# Patient Record
Sex: Female | Born: 1985 | Race: Black or African American | Hispanic: No | Marital: Married | Smoking: Current every day smoker
Health system: Southern US, Community
[De-identification: ages and names within clinical notes are randomized; demographics above are authoritative.]

## PROBLEM LIST (undated history)

## (undated) DIAGNOSIS — J45909 Unspecified asthma, uncomplicated: Secondary | ICD-10-CM

## (undated) DIAGNOSIS — R011 Cardiac murmur, unspecified: Secondary | ICD-10-CM

## (undated) DIAGNOSIS — D649 Anemia, unspecified: Secondary | ICD-10-CM

## (undated) HISTORY — PX: BREAST SURGERY: SHX581

## (undated) HISTORY — PX: UPPER GI ENDOSCOPY: SHX6162

---
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
## 2009-03-02 ENCOUNTER — Emergency Department (HOSPITAL_BASED_OUTPATIENT_CLINIC_OR_DEPARTMENT_OTHER): Admission: EM | Admit: 2009-03-02 | Discharge: 2009-03-02 | Payer: Self-pay | Admitting: Emergency Medicine

## 2009-03-26 ENCOUNTER — Emergency Department (HOSPITAL_COMMUNITY): Admission: EM | Admit: 2009-03-26 | Discharge: 2009-03-26 | Payer: Self-pay | Admitting: Emergency Medicine

## 2009-12-31 ENCOUNTER — Emergency Department (HOSPITAL_COMMUNITY): Admission: EM | Admit: 2009-12-31 | Discharge: 2010-01-01 | Payer: Self-pay | Admitting: Emergency Medicine

## 2010-03-31 ENCOUNTER — Emergency Department (HOSPITAL_COMMUNITY)
Admission: EM | Admit: 2010-03-31 | Discharge: 2010-03-31 | Payer: Self-pay | Source: Home / Self Care | Admitting: Emergency Medicine

## 2010-07-11 LAB — DIFFERENTIAL
Basophils Absolute: 0 10*3/uL (ref 0.0–0.1)
Basophils Relative: 1 % (ref 0–1)
Eosinophils Absolute: 0.2 10*3/uL (ref 0.0–0.7)
Eosinophils Relative: 5 % (ref 0–5)
Lymphocytes Relative: 59 % — ABNORMAL HIGH (ref 12–46)
Lymphs Abs: 2.4 10*3/uL (ref 0.7–4.0)
Monocytes Absolute: 0.3 10*3/uL (ref 0.1–1.0)
Monocytes Relative: 7 % (ref 3–12)
Neutro Abs: 1.2 10*3/uL — ABNORMAL LOW (ref 1.7–7.7)
Neutrophils Relative %: 29 % — ABNORMAL LOW (ref 43–77)

## 2010-07-11 LAB — COMPREHENSIVE METABOLIC PANEL
ALT: 12 U/L (ref 0–35)
AST: 20 U/L (ref 0–37)
Albumin: 4 g/dL (ref 3.5–5.2)
Alkaline Phosphatase: 51 U/L (ref 39–117)
BUN: 13 mg/dL (ref 6–23)
CO2: 24 mEq/L (ref 19–32)
Calcium: 9.6 mg/dL (ref 8.4–10.5)
Chloride: 105 mEq/L (ref 96–112)
Creatinine, Ser: 0.54 mg/dL (ref 0.4–1.2)
GFR calc Af Amer: >60 mL/min (ref >60)
GFR calc non Af Amer: >60 mL/min (ref >60)
Glucose, Bld: 93 mg/dL (ref 70–99)
Potassium: 4.1 mEq/L (ref 3.5–5.1)
Sodium: 141 mEq/L (ref 135–145)
Total Bilirubin: 0.1 mg/dL — ABNORMAL LOW (ref 0.3–1.2)
Total Protein: 7 g/dL (ref 6.0–8.3)

## 2010-07-11 LAB — CBC
HCT: 37.4 % (ref 36.0–46.0)
Hemoglobin: 12.6 g/dL (ref 12.0–15.0)
MCH: 29 pg (ref 26.0–34.0)
MCHC: 33.7 g/dL (ref 30.0–36.0)
MCV: 86.2 fL (ref 78.0–100.0)
Platelets: 233 10*3/uL (ref 150–400)
RBC: 4.34 MIL/uL (ref 3.87–5.11)
RDW: 14.8 % (ref 11.5–15.5)
WBC: 4.2 10*3/uL (ref 4.0–10.5)

## 2010-07-11 LAB — LIPASE, BLOOD: Lipase: 25 U/L (ref 11–59)

## 2010-07-22 ENCOUNTER — Emergency Department (HOSPITAL_COMMUNITY)
Admission: EM | Admit: 2010-07-22 | Discharge: 2010-07-23 | Disposition: A | Discharge status: Home / Self Care | Payer: Medicaid Other | Attending: Emergency Medicine | Admitting: Emergency Medicine

## 2010-07-22 DIAGNOSIS — J45909 Unspecified asthma, uncomplicated: Secondary | ICD-10-CM | POA: Insufficient documentation

## 2010-07-23 ENCOUNTER — Emergency Department (HOSPITAL_COMMUNITY): Payer: Medicaid Other

## 2010-08-03 LAB — RHOGAM INJECTION

## 2010-08-03 LAB — WET PREP, GENITAL
Clue Cells Wet Prep HPF POC: NONE SEEN
Trich, Wet Prep: NONE SEEN
Yeast Wet Prep HPF POC: NONE SEEN

## 2010-08-03 LAB — URINALYSIS, ROUTINE W REFLEX MICROSCOPIC
Bilirubin Urine: NEGATIVE
Glucose, UA: NEGATIVE mg/dL
Ketones, ur: NEGATIVE mg/dL
Nitrite: NEGATIVE
Protein, ur: 30 mg/dL — AB
Specific Gravity, Urine: 1.022 (ref 1.005–1.030)
Urobilinogen, UA: 0.2 mg/dL (ref 0.0–1.0)
pH: 5.5 (ref 5.0–8.0)

## 2010-08-03 LAB — GC/CHLAMYDIA PROBE AMP, GENITAL
Chlamydia, DNA Probe: NEGATIVE
GC Probe Amp, Genital: NEGATIVE

## 2010-08-03 LAB — URINE MICROSCOPIC-ADD ON

## 2010-08-03 LAB — ABO/RH: ABO/RH(D): AB NEG

## 2010-08-03 LAB — POCT PREGNANCY, URINE: Preg Test, Ur: POSITIVE

## 2011-03-25 ENCOUNTER — Encounter: Payer: Self-pay | Admitting: *Deleted

## 2011-03-25 ENCOUNTER — Emergency Department (HOSPITAL_COMMUNITY)
Admission: EM | Admit: 2011-03-25 | Discharge: 2011-03-25 | Disposition: A | Discharge status: Home / Self Care | Payer: Medicaid Other | Attending: Emergency Medicine | Admitting: Emergency Medicine

## 2011-03-25 DIAGNOSIS — L509 Urticaria, unspecified: Secondary | ICD-10-CM | POA: Insufficient documentation

## 2011-03-25 DIAGNOSIS — L298 Other pruritus: Secondary | ICD-10-CM | POA: Insufficient documentation

## 2011-03-25 DIAGNOSIS — L2989 Other pruritus: Secondary | ICD-10-CM | POA: Insufficient documentation

## 2011-03-25 DIAGNOSIS — T7840XA Allergy, unspecified, initial encounter: Secondary | ICD-10-CM | POA: Insufficient documentation

## 2011-03-25 DIAGNOSIS — R21 Rash and other nonspecific skin eruption: Secondary | ICD-10-CM | POA: Insufficient documentation

## 2011-03-25 DIAGNOSIS — I10 Essential (primary) hypertension: Secondary | ICD-10-CM | POA: Insufficient documentation

## 2011-03-25 DIAGNOSIS — R0602 Shortness of breath: Secondary | ICD-10-CM | POA: Insufficient documentation

## 2011-03-25 HISTORY — DX: Cardiac murmur, unspecified: R01.1

## 2011-03-25 HISTORY — DX: Anemia, unspecified: D64.9

## 2011-03-25 MED ORDER — FAMOTIDINE 20 MG PO TABS: 20.0000 mg | ORAL_TABLET | Freq: Two times a day (BID) | ORAL | Status: DC

## 2011-03-25 MED ORDER — PREDNISONE 20 MG PO TABS
60.0000 mg | ORAL_TABLET | Freq: Once | ORAL | Status: AC
  Administered 2011-03-25: 60 mg via ORAL
  Filled 2011-03-25: qty 3

## 2011-03-25 MED ORDER — FAMOTIDINE 20 MG PO TABS
20.0000 mg | ORAL_TABLET | Freq: Once | ORAL | Status: AC
  Administered 2011-03-25: 20 mg via ORAL
  Filled 2011-03-25: qty 1

## 2011-03-25 MED ORDER — PREDNISONE 20 MG PO TABS: 60.0000 mg | ORAL_TABLET | Freq: Every day | ORAL | Status: AC

## 2011-03-25 MED ORDER — EPINEPHRINE 0.3 MG/0.3ML IJ DEVI: 0.3000 mg | INTRAMUSCULAR | Status: DC | PRN

## 2011-03-25 MED ORDER — DIPHENHYDRAMINE HCL 25 MG PO TABS: 25.0000 mg | ORAL_TABLET | Freq: Four times a day (QID) | ORAL | Status: AC

## 2011-03-25 MED ORDER — DIPHENHYDRAMINE HCL 25 MG PO CAPS
25.0000 mg | ORAL_CAPSULE | Freq: Once | ORAL | Status: AC
  Administered 2011-03-25: 25 mg via ORAL
  Filled 2011-03-25: qty 1

## 2011-03-25 NOTE — ED Notes (Addendum)
C/o L sided rib/chest pain, pinpoints to axillary area, also L neck and bilateral wrist/hand/FAs tingling, also sob, worse with inspiration, h/o asthma. Describes pain as tightness.

## 2011-03-25 NOTE — ED Provider Notes (Signed)
History     CSN: [id number] Arrival date & time: 03/25/2011  2:15 AM   First MD Initiated Contact with Patient 03/25/11 0518      Chief Complaint  Patient presents with  . Shortness of Breath    L axillary side pain, worse with deep breath, also L neck and bilateral hand/wrists/FAs    (Consider location/radiation/quality/duration/timing/severity/associated sxs/prior treatment) Patient is a 25 y.o. female presenting with allergic reaction. The history is provided by the patient.  Allergic Reaction The primary symptoms are  shortness of breath, rash and urticaria. The primary symptoms do not include abdominal pain or palpitations. The current episode started 1 to 2 hours ago. The problem has been gradually improving. This is a new problem.  The shortness of breath began today. The shortness of breath developed gradually. The shortness of breath is mild.  The rash is associated with itching.  Associated with: No new medications. Patient believes is due to getting collard greens around 10 PM. Her symptoms started around 1 AM. Significant symptoms also include itching.   onset today. Has a rash and itching and some shortness of breath, she used her inhaler at home and is now no longer having any difficulty breathing. Note, or lip swelling. Rash and itching are persistent. No history of the same. Moderate in severity. No history of anaphylaxis. No known food allergies. Patient states she's been meeting greens all day long  Past Medical History  Diagnosis Date  . Asthma   . Heart murmur   . Anemia     History reviewed. No pertinent past surgical history.  Family History  Problem Relation Age of Onset  . Asthma Mother   . Hypertension Mother     History  Substance Use Topics  . Smoking status: Current Everyday Smoker -- 2.0 packs/day  . Smokeless tobacco: Not on file  . Alcohol Use: No    OB History    Grav Para Term Preterm Abortions TAB SAB Ect Mult Living                   Review of Systems  Constitutional: Negative for fever and chills.  HENT: Negative for neck pain and neck stiffness.   Eyes: Negative for pain.  Respiratory: Positive for shortness of breath.   Cardiovascular: Negative for chest pain, palpitations and leg swelling.  Gastrointestinal: Negative for abdominal pain.  Genitourinary: Negative for dysuria.  Musculoskeletal: Negative for back pain.  Skin: Positive for itching and rash.  Neurological: Negative for headaches.  All other systems reviewed and are negative.    Allergies  Review of patient's allergies indicates no known allergies.  Home Medications  No current outpatient prescriptions on file.  BP 121/71  Pulse 69  Temp(Src) 98.2 F (36.8 C) (Oral)  Resp 18  Ht 5\' 4"  (1.626 m)  Wt 156 lb (70.761 kg)  BMI 26.78 kg/m2  SpO2 98%  LMP 02/13/2011  Physical Exam  Constitutional: She is oriented to person, place, and time. She appears well-developed and well-nourished.  HENT:  Head: Normocephalic and atraumatic.  Eyes: Conjunctivae and EOM are normal. Pupils are equal, round, and reactive to light.  Neck: Trachea normal. Neck supple. No thyromegaly present.  Cardiovascular: Normal rate, regular rhythm, S1 normal, S2 normal and normal pulses.     No systolic murmur is present   No diastolic murmur is present  Pulses:      Radial pulses are 2+ on the right side, and 2+ on the left side.  Pulmonary/Chest:  Effort normal and breath sounds normal. No stridor. She has no wheezes. She has no rhonchi.  Abdominal: Soft. Normal appearance and bowel sounds are normal. There is no tenderness. There is no CVA tenderness and negative Murphy's sign.  Musculoskeletal:       BLE:s Calves nontender, no cords or erythema, negative Homans sign  Neurological: She is alert and oriented to person, place, and time. She has normal strength. No cranial nerve deficit or sensory deficit. GCS eye subscore is 4. GCS verbal subscore is 5. GCS motor  subscore is 6.  Skin: Skin is warm and dry. Rash noted. She is not diaphoretic.       Erythematous and urticarial rash involves left-sided neck and torso multiple areas consistent with hives  Psychiatric: Her speech is normal.       Cooperative and appropriate    ED Course  Procedures (including critical care time)   Medications provided for clinical allergic reaction  MDM   Mild symptoms on arrival ED. by mouth medications and observation. No stridor or airway compromise. Patient stable for discharge home with EpiPen, prednisone, Benadryl, Pepcid. Reliable historian states understanding strict return precautions       Sunnie Nielsen, MD 03/25/11 [phone]

## 2019-11-12 ENCOUNTER — Encounter (HOSPITAL_COMMUNITY): Payer: Self-pay | Admitting: Emergency Medicine

## 2019-11-12 ENCOUNTER — Emergency Department (HOSPITAL_COMMUNITY)
Admission: EM | Admit: 2019-11-12 | Discharge: 2019-11-13 | Disposition: A | Discharge status: Home / Self Care | Payer: Medicaid Other | Attending: Emergency Medicine | Admitting: Emergency Medicine

## 2019-11-12 DIAGNOSIS — R1030 Lower abdominal pain, unspecified: Secondary | ICD-10-CM | POA: Insufficient documentation

## 2019-11-12 DIAGNOSIS — N920 Excessive and frequent menstruation with regular cycle: Secondary | ICD-10-CM | POA: Insufficient documentation

## 2019-11-12 DIAGNOSIS — Z5321 Procedure and treatment not carried out due to patient leaving prior to being seen by health care provider: Secondary | ICD-10-CM | POA: Insufficient documentation

## 2019-11-12 LAB — BASIC METABOLIC PANEL
Anion gap: 8 (ref 5–15)
BUN: 17 mg/dL (ref 6–20)
CO2: 22 mmol/L (ref 22–32)
Calcium: 9 mg/dL (ref 8.9–10.3)
Chloride: 111 mmol/L (ref 98–111)
Creatinine, Ser: 0.64 mg/dL (ref 0.44–1.00)
GFR calc Af Amer: >60 mL/min (ref >60)
GFR calc non Af Amer: >60 mL/min (ref >60)
Glucose, Bld: 96 mg/dL (ref 70–99)
Potassium: 4 mmol/L (ref 3.5–5.1)
Sodium: 141 mmol/L (ref 135–145)

## 2019-11-12 LAB — I-STAT BETA HCG BLOOD, ED (MC, WL, AP ONLY): I-stat hCG, quantitative: <5 m[IU]/mL (ref <5)

## 2019-11-12 LAB — CBC
HCT: 29.8 % — ABNORMAL LOW (ref 36.0–46.0)
Hemoglobin: 9.3 g/dL — ABNORMAL LOW (ref 12.0–15.0)
MCH: 26.9 pg (ref 26.0–34.0)
MCHC: 31.2 g/dL (ref 30.0–36.0)
MCV: 86.1 fL (ref 80.0–100.0)
Platelets: 436 10*3/uL — ABNORMAL HIGH (ref 150–400)
RBC: 3.46 MIL/uL — ABNORMAL LOW (ref 3.87–5.11)
RDW: 13.4 % (ref 11.5–15.5)
WBC: 6.4 10*3/uL (ref 4.0–10.5)
nRBC: 0 % (ref 0.0–0.2)

## 2019-11-12 NOTE — ED Notes (Signed)
Pt does not want to wait, she will jut follow up with her PCP

## 2019-11-12 NOTE — ED Provider Notes (Signed)
Patient placed in Quick Look pathway, seen and evaluated   Chief Complaint: vaginal bleeding  HPI:   Vaginal bleeding with intermittent lower abdominal cramping since June 1 with a short break at the end of June. Denies fever, N/V/D, syncope, current abdominal pain.  ROS: Vaginal bleeding (one)  Physical Exam:   Gen: No distress  Neuro: Awake and Alert  Skin: Warm    Focused Exam:   No abdominal tenderness.    Initiation of care has begun. The patient has been counseled on the process, plan, and necessity for staying for the completion/evaluation, and the remainder of the medical screening examination   Concepcion Living 11/12/19 1840    Melene Plan, DO 11/12/19 2333

## 2019-11-12 NOTE — ED Triage Notes (Signed)
Patient in POV, reports lower abd pain X several weeks as well as heavy menstrual cycle. States she has taken birth control X2 years with no period and recently was taken off and has had a continuous period since beginning of July.

## 2019-11-13 ENCOUNTER — Telehealth (HOSPITAL_COMMUNITY): Payer: Self-pay | Admitting: Emergency Medicine

## 2019-11-13 NOTE — Telephone Encounter (Cosign Needed)
2:35 PM Patient called back in response to my previous phone call and VM.  We discussed her hemoglobin level of 9.3, previously 11.0 three weeks ago.  Suspected to be due to her recurrent vaginal bleeding.  Continues to deny syncope, chest pain, shortness of breath. States she will follow-up with her PCP on this matter.  We discussed OB/GYN follow-up.  She states she will work through her PCP for any OB/GYN follow-up or referral. Return precautions discussed.  She voices understanding and agreement.

## 2019-11-13 NOTE — ED Notes (Signed)
Pt did not respond when called for vitals recheck 

## 2019-11-13 NOTE — Telephone Encounter (Cosign Needed)
2:27 PM Attempted to call patient to discuss abnormal lab results that were noted from her ED visit yesterday.  No answer.  Left voicemail with callback number.

## 2020-05-14 ENCOUNTER — Other Ambulatory Visit: Payer: Self-pay

## 2020-05-14 ENCOUNTER — Encounter (HOSPITAL_COMMUNITY): Payer: Self-pay

## 2020-05-14 ENCOUNTER — Emergency Department (HOSPITAL_COMMUNITY): Payer: Medicaid Other

## 2020-05-14 ENCOUNTER — Emergency Department (HOSPITAL_COMMUNITY)
Admission: EM | Admit: 2020-05-14 | Discharge: 2020-05-14 | Disposition: A | Discharge status: Home / Self Care | Payer: Medicaid Other | Attending: Emergency Medicine | Admitting: Emergency Medicine

## 2020-05-14 DIAGNOSIS — Y92512 Supermarket, store or market as the place of occurrence of the external cause: Secondary | ICD-10-CM | POA: Diagnosis not present

## 2020-05-14 DIAGNOSIS — M79672 Pain in left foot: Secondary | ICD-10-CM | POA: Insufficient documentation

## 2020-05-14 DIAGNOSIS — Y990 Civilian activity done for income or pay: Secondary | ICD-10-CM | POA: Diagnosis not present

## 2020-05-14 DIAGNOSIS — J45909 Unspecified asthma, uncomplicated: Secondary | ICD-10-CM | POA: Insufficient documentation

## 2020-05-14 DIAGNOSIS — F17210 Nicotine dependence, cigarettes, uncomplicated: Secondary | ICD-10-CM | POA: Insufficient documentation

## 2020-05-14 DIAGNOSIS — M79644 Pain in right finger(s): Secondary | ICD-10-CM | POA: Insufficient documentation

## 2020-05-14 MED ORDER — NAPROXEN 500 MG PO TABS: 500.0000 mg | ORAL_TABLET | Freq: Two times a day (BID) | ORAL | 0 refills | Status: DC

## 2020-05-14 NOTE — ED Triage Notes (Signed)
Pt c/o pain to right ring finger states "I fractured my finger, I hit it on something yesterday" pt also c/o Left foot pain.

## 2020-05-14 NOTE — ED Provider Notes (Signed)
[facility] EMERGENCY DEPARTMENT Provider Note   CSN: [id number] Arrival date & time: 05/14/20  1326     History Chief Complaint  Patient presents with  . Finger Injury    Foot pain    . Foot Pain    Robin Mcbride is a 35 y.o. female.  HPI Patient is a 35 year old female who presents to the emergency department due to finger pain and foot pain.  Symptoms started in the past few days.  She states she believes she was reaching for something and accidentally hyperextended her right ring finger.  History of fracture to the finger.  Reports moderate pain and swelling to the middle phalanx of the right finger.  Also reports left foot pain.  She works at [employer] and states she is on her feet and ambulates throughout the day.  Believes she might have struck her foot on a piece of furniture.  Denies numbness, tingling, weakness.    Past Medical History:  Diagnosis Date  . Anemia   . Asthma   . Heart murmur     There are no problems to display for this patient.   Past Surgical History:  Procedure Laterality Date  . BREAST SURGERY       OB History   No obstetric history on file.     Family History  Problem Relation Age of Onset  . Asthma Mother   . Hypertension Mother     Social History   Tobacco Use  . Smoking status: Current Every Day Smoker    Packs/day: 2.00  . Smokeless tobacco: Never Used  Substance Use Topics  . Alcohol use: No  . Drug use: No    Home Medications Prior to Admission medications   Medication Sig Start Date End Date Taking? Authorizing Provider  naproxen (NAPROSYN) 500 MG tablet Take 1 tablet (500 mg total) by mouth 2 (two) times daily. 05/14/20  Yes Placido Sou, PA-C  EPINEPHrine (EPIPEN) 0.3 mg/0.3 mL DEVI Inject 0.3 mLs (0.3 mg total) into the muscle as needed. 03/25/11   Sunnie Nielsen, MD  famotidine (PEPCID) 20 MG tablet Take 1 tablet (20 mg total) by mouth 2 (two) times daily. 03/25/11 03/24/12  Sunnie Nielsen, MD     Allergies    Patient has no known allergies.  Review of Systems   Review of Systems  Musculoskeletal: Positive for arthralgias, joint swelling and myalgias.  Skin: Negative for color change and wound.  Neurological: Negative for weakness and numbness.    Physical Exam Updated Vital Signs BP 123/72   Pulse 91   Temp 98 F (36.7 C) (Oral)   Resp 16   SpO2 96%   Physical Exam Vitals and nursing note reviewed.  Constitutional:      General: She is not in acute distress.    Appearance: She is well-developed.  HENT:     Head: Normocephalic and atraumatic.     Right Ear: External ear normal.     Left Ear: External ear normal.  Eyes:     General: No scleral icterus.       Right eye: No discharge.        Left eye: No discharge.     Conjunctiva/sclera: Conjunctivae normal.  Neck:     Trachea: No tracheal deviation.  Cardiovascular:     Rate and Rhythm: Normal rate.  Pulmonary:     Effort: Pulmonary effort is normal. No respiratory distress.     Breath sounds: No stridor.  Abdominal:  General: There is no distension.  Musculoskeletal:        General: Tenderness present. No swelling or deformity.     Cervical back: Neck supple.     Right lower leg: No edema.     Left lower leg: No edema.     Comments: Mild TTP noted along the first left MTP.  No increased warmth, redness, swelling.  Unable to assess range of motion secondary to patient's pain.  Patient is ambulatory.  Distal sensation intact.  Good cap refill.  Additional mild TTP noted along the middle phalanx of the right ring finger.  Trace edema in the region.  No erythema or increased warmth.  No signs of infection or paronychia on my exam.  Unable to assess range of motion secondary to pain.  Distal sensation intact.  Good cap refill.  Skin:    General: Skin is warm and dry.     Findings: No rash.  Neurological:     Mental Status: She is alert.     Cranial Nerves: Cranial nerve deficit: no gross deficits.     ED Results / Procedures / Treatments   Labs (all labs ordered are listed, but only abnormal results are displayed) Labs Reviewed - No data to display  EKG None  Radiology DG Finger Ring Right  Result Date: 05/14/2020 CLINICAL DATA:  Injury EXAM: RIGHT RING FINGER 2+V COMPARISON:  None. FINDINGS: No acute fracture or dislocation of the ring finger. There appears to have been a prior, well callused oblique fracture of the middle phalanx. Joint spaces are preserved. Soft tissue edema. IMPRESSION: 1. No acute fracture or dislocation of the ring finger. There appears to have been a prior, well callused oblique fracture of the middle phalanx. 2.  Soft tissue edema. Electronically Signed   By: Lauralyn Primes M.D.   On: 05/14/2020 14:02   Procedures Procedures (including critical care time)  Medications Ordered in ED Medications - No data to display  ED Course  I have reviewed the triage vital signs and the nursing notes.  Pertinent labs & imaging results that were available during my care of the patient were reviewed by me and considered in my medical decision making (see chart for details).    MDM Rules/Calculators/A&P                          Patient presents with right finger pain as well as left foot pain.  History of fracture to the affected finger.  X-ray obtained in triage showing no acute fracture or dislocation.  There is a prior well callused oblique fracture of the middle phalanx.  Patient is tender over the middle phalanx.  There is some mild soft tissue edema.  No increased warmth.  No signs of paronychia or infection.  Additional mild tenderness along the first MTP of the left foot.  Patient believes she struck her foot on a piece of furniture but is unsure.  She is ambulatory.  Neurovascularly intact in the hand and the foot.  No red flags.  Requested a splint for the finger.  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  Will discharge on a course of naproxen.  Given orthopedic follow-up.  Discussed return precautions.  Her questions were answered and she was amicable at the time of discharge.  Final Clinical Impression(s) / ED Diagnoses Final diagnoses:  Finger pain,  right  Left foot pain   Rx / DC Orders ED Discharge Orders         Ordered    naproxen (NAPROSYN) 500 MG tablet  2 times daily        05/14/20 1523           Placido Sou, PA-C 05/14/20 1531    Terald Sleeper, MD 05/14/20 [phone]

## 2020-05-14 NOTE — Discharge Instructions (Addendum)
Like we discussed, please apply ice to your foot and finger.  I have prescribed you an anti-inflammatory called naproxen.  Please take this as prescribed.  I have given you follow-up information below with a local orthopedist in the area.  Please feel free to give them a call if your symptoms do not improve.  You can always return to the emergency department if your symptoms worsen.  It was a pleasure to meet you.

## 2020-11-16 ENCOUNTER — Other Ambulatory Visit: Payer: Self-pay

## 2020-11-16 ENCOUNTER — Encounter (HOSPITAL_BASED_OUTPATIENT_CLINIC_OR_DEPARTMENT_OTHER): Payer: Self-pay | Admitting: Emergency Medicine

## 2020-11-16 ENCOUNTER — Encounter (HOSPITAL_COMMUNITY): Payer: Self-pay

## 2020-11-16 ENCOUNTER — Emergency Department (HOSPITAL_BASED_OUTPATIENT_CLINIC_OR_DEPARTMENT_OTHER)
Admission: EM | Admit: 2020-11-16 | Discharge: 2020-11-16 | Disposition: A | Discharge status: Home / Self Care | Payer: Medicaid Other | Attending: Emergency Medicine | Admitting: Emergency Medicine

## 2020-11-16 DIAGNOSIS — R5383 Other fatigue: Secondary | ICD-10-CM | POA: Insufficient documentation

## 2020-11-16 DIAGNOSIS — R112 Nausea with vomiting, unspecified: Secondary | ICD-10-CM | POA: Diagnosis not present

## 2020-11-16 DIAGNOSIS — R000 Tachycardia, unspecified: Secondary | ICD-10-CM | POA: Insufficient documentation

## 2020-11-16 DIAGNOSIS — R197 Diarrhea, unspecified: Secondary | ICD-10-CM | POA: Insufficient documentation

## 2020-11-16 DIAGNOSIS — J45909 Unspecified asthma, uncomplicated: Secondary | ICD-10-CM | POA: Diagnosis not present

## 2020-11-16 DIAGNOSIS — R1013 Epigastric pain: Secondary | ICD-10-CM | POA: Diagnosis not present

## 2020-11-16 DIAGNOSIS — F17210 Nicotine dependence, cigarettes, uncomplicated: Secondary | ICD-10-CM | POA: Diagnosis not present

## 2020-11-16 DIAGNOSIS — K529 Noninfective gastroenteritis and colitis, unspecified: Secondary | ICD-10-CM

## 2020-11-16 HISTORY — DX: Unspecified asthma, uncomplicated: J45.909

## 2020-11-16 LAB — COMPREHENSIVE METABOLIC PANEL
ALT: 16 U/L (ref 0–44)
AST: 17 U/L (ref 15–41)
Albumin: 4.4 g/dL (ref 3.5–5.0)
Alkaline Phosphatase: 71 U/L (ref 38–126)
Anion gap: 9 (ref 5–15)
BUN: 9 mg/dL (ref 6–20)
CO2: 26 mmol/L (ref 22–32)
Calcium: 9.6 mg/dL (ref 8.9–10.3)
Chloride: 104 mmol/L (ref 98–111)
Creatinine, Ser: 0.63 mg/dL (ref 0.44–1.00)
GFR, Estimated: >60 mL/min (ref >60)
Glucose, Bld: 100 mg/dL — ABNORMAL HIGH (ref 70–99)
Potassium: 3.7 mmol/L (ref 3.5–5.1)
Sodium: 139 mmol/L (ref 135–145)
Total Bilirubin: 0.5 mg/dL (ref 0.3–1.2)
Total Protein: 7.7 g/dL (ref 6.5–8.1)

## 2020-11-16 LAB — URINALYSIS, ROUTINE W REFLEX MICROSCOPIC
Bilirubin Urine: NEGATIVE
Glucose, UA: NEGATIVE mg/dL
Hgb urine dipstick: NEGATIVE
Ketones, ur: NEGATIVE mg/dL
Leukocytes,Ua: NEGATIVE
Nitrite: NEGATIVE
Protein, ur: NEGATIVE mg/dL
Specific Gravity, Urine: >1.03 — ABNORMAL HIGH (ref 1.005–1.030)
pH: 5.5 (ref 5.0–8.0)

## 2020-11-16 LAB — CBC WITH DIFFERENTIAL/PLATELET
Abs Immature Granulocytes: 0.02 10*3/uL (ref 0.00–0.07)
Basophils Absolute: 0 10*3/uL (ref 0.0–0.1)
Basophils Relative: 1 %
Eosinophils Absolute: 0.1 10*3/uL (ref 0.0–0.5)
Eosinophils Relative: 2 %
HCT: 39.4 % (ref 36.0–46.0)
Hemoglobin: 12.9 g/dL (ref 12.0–15.0)
Immature Granulocytes: 0 %
Lymphocytes Relative: 40 %
Lymphs Abs: 2.4 10*3/uL (ref 0.7–4.0)
MCH: 26.7 pg (ref 26.0–34.0)
MCHC: 32.7 g/dL (ref 30.0–36.0)
MCV: 81.6 fL (ref 80.0–100.0)
Monocytes Absolute: 0.3 10*3/uL (ref 0.1–1.0)
Monocytes Relative: 4 %
Neutro Abs: 3.2 10*3/uL (ref 1.7–7.7)
Neutrophils Relative %: 53 %
Platelets: 381 10*3/uL (ref 150–400)
RBC: 4.83 MIL/uL (ref 3.87–5.11)
RDW: 16.9 % — ABNORMAL HIGH (ref 11.5–15.5)
WBC: 6.1 10*3/uL (ref 4.0–10.5)
nRBC: 0 % (ref 0.0–0.2)

## 2020-11-16 LAB — PREGNANCY, URINE: Preg Test, Ur: NEGATIVE

## 2020-11-16 LAB — LIPASE, BLOOD: Lipase: 25 U/L (ref 11–51)

## 2020-11-16 MED ORDER — ONDANSETRON 4 MG PO TBDP: 4.0000 mg | ORAL_TABLET | Freq: Three times a day (TID) | ORAL | 0 refills | Status: DC | PRN

## 2020-11-16 MED ORDER — LIDOCAINE VISCOUS HCL 2 % MT SOLN
15.0000 mL | Freq: Once | OROMUCOSAL | Status: AC
  Administered 2020-11-16: 15 mL via ORAL
  Filled 2020-11-16: qty 15

## 2020-11-16 MED ORDER — ALUM & MAG HYDROXIDE-SIMETH 200-200-20 MG/5ML PO SUSP
30.0000 mL | Freq: Once | ORAL | Status: AC
  Administered 2020-11-16: 30 mL via ORAL
  Filled 2020-11-16: qty 30

## 2020-11-16 MED ORDER — SODIUM CHLORIDE 0.9 % IV BOLUS
1000.0000 mL | Freq: Once | INTRAVENOUS | Status: AC
  Administered 2020-11-16: 1000 mL via INTRAVENOUS

## 2020-11-16 MED ORDER — PANTOPRAZOLE SODIUM 40 MG IV SOLR
40.0000 mg | Freq: Once | INTRAVENOUS | Status: AC
  Administered 2020-11-16: 40 mg via INTRAVENOUS
  Filled 2020-11-16: qty 40

## 2020-11-16 MED ORDER — ONDANSETRON HCL 4 MG/2ML IJ SOLN
4.0000 mg | Freq: Once | INTRAMUSCULAR | Status: AC
  Administered 2020-11-16: 4 mg via INTRAVENOUS
  Filled 2020-11-16: qty 2

## 2020-11-16 NOTE — ED Notes (Signed)
States she arrived by EMS and no other means to return home by family, friend etc. Cab Voucher provided.

## 2020-11-16 NOTE — ED Provider Notes (Signed)
[facility] EMERGENCY DEPARTMENT Provider Note   CSN: [id number] Arrival date & time: 11/16/20  0153     History Chief Complaint  Patient presents with   Abdominal Pain    Robin Mcbride is a 35 y.o. female.  Patient reports epigastric pain with nausea, vomiting and diarrhea since approximately 3 AM July 18.  Awoke with nausea and multiple episodes of nonbloody nonbilious emesis x4 throughout the day yesterday.  Has had 2 episodes of loose nonbloody diarrhea as well.  Crampy upper abdominal pain that is constant.  No fever.  No pain with urination or blood in the urine.  No vaginal bleeding or discharge.  Denies any possibility of pregnancy.  No recent travel or sick contacts.  Did finish amoxicillin for a tonsil infection yesterday. No previous abdominal surgeries.  No chest pain or shortness of breath.  The history is provided by the patient.  Abdominal Pain Associated symptoms: diarrhea, fatigue, nausea and vomiting   Associated symptoms: no chest pain, no cough, no dysuria, no fever, no hematuria and no shortness of breath       Past Medical History:  Diagnosis Date   Asthma     There are no problems to display for this patient.   Past Surgical History:  Procedure Laterality Date   BREAST SURGERY       OB History   No obstetric history on file.     No family history on file.  Social History   Tobacco Use   Smoking status: Every Day    Packs/day: 1.00    Types: Cigarettes   Smokeless tobacco: Never  Substance Use Topics   Alcohol use: Never   Drug use: Never    Home Medications Prior to Admission medications   Not on File    Allergies    Motrin [ibuprofen]  Review of Systems   Review of Systems  Constitutional:  Positive for activity change, appetite change and fatigue. Negative for fever.  HENT:  Negative for congestion and rhinorrhea.   Respiratory:  Negative for cough, chest tightness and shortness of breath.   Cardiovascular:   Negative for chest pain and leg swelling.  Gastrointestinal:  Positive for abdominal pain, diarrhea, nausea and vomiting.  Genitourinary:  Negative for dysuria and hematuria.  Musculoskeletal:  Negative for arthralgias and myalgias.  Skin:  Negative for rash.  Neurological:  Negative for dizziness, weakness and headaches.   all other systems are negative except as noted in the HPI and PMH.   Physical Exam Updated Vital Signs BP 127/78 (BP Location: Right Arm)   Pulse (!) 108   Temp (!) 97.5 F (36.4 C) (Oral)   Resp 20   Ht 5\' 4"  (1.626 m)   Wt 88.5 kg   LMP 10/29/2020   SpO2 100%   BMI 33.47 kg/m   Physical Exam Vitals and nursing note reviewed.  Constitutional:      General: She is not in acute distress.    Appearance: She is well-developed.  HENT:     Head: Normocephalic and atraumatic.     Mouth/Throat:     Pharynx: No oropharyngeal exudate.  Eyes:     Conjunctiva/sclera: Conjunctivae normal.     Pupils: Pupils are equal, round, and reactive to light.  Neck:     Comments: No meningismus. Cardiovascular:     Rate and Rhythm: Normal rate and regular rhythm.     Heart sounds: Normal heart sounds. No murmur heard. Pulmonary:     Effort: Pulmonary  effort is normal. No respiratory distress.     Breath sounds: Normal breath sounds.  Abdominal:     Palpations: Abdomen is soft.     Tenderness: There is abdominal tenderness. There is no guarding or rebound.     Comments: Mild epigastric tenderness  Musculoskeletal:        General: No tenderness. Normal range of motion.     Cervical back: Normal range of motion and neck supple.  Skin:    General: Skin is warm.  Neurological:     Mental Status: She is alert and oriented to person, place, and time.     Cranial Nerves: No cranial nerve deficit.     Motor: No abnormal muscle tone.     Coordination: Coordination normal.     Comments:  5/5 strength throughout. CN 2-12 intact.Equal grip strength.   Psychiatric:         Behavior: Behavior normal.    ED Results / Procedures / Treatments   Labs (all labs ordered are listed, but only abnormal results are displayed) Labs Reviewed  URINALYSIS, ROUTINE W REFLEX MICROSCOPIC - Abnormal; Notable for the following components:      Result Value   Specific Gravity, Urine >1.030 (*)    All other components within normal limits  CBC WITH DIFFERENTIAL/PLATELET - Abnormal; Notable for the following components:   RDW 16.9 (*)    All other components within normal limits  COMPREHENSIVE METABOLIC PANEL - Abnormal; Notable for the following components:   Glucose, Bld 100 (*)    All other components within normal limits  PREGNANCY, URINE  LIPASE, BLOOD    EKG None  Radiology No results found.  Procedures Procedures   Medications Ordered in ED Medications  sodium chloride 0.9 % bolus 1,000 mL (has no administration in time range)  ondansetron (ZOFRAN) injection 4 mg (has no administration in time range)  pantoprazole (PROTONIX) injection 40 mg (has no administration in time range)    ED Course  I have reviewed the triage vital signs and the nursing notes.  Pertinent labs & imaging results that were available during my care of the patient were reviewed by me and considered in my medical decision making (see chart for details).    MDM Rules/Calculators/A&P                         24 hours of upper abdominal pain with nausea, vomiting and diarrhea.  Abdomen soft without peritoneal signs.  Moist mucous membranes, mildly tachycardic.  Give fluids and check labs. low suspicion for appendicitis or cholecystitis.  Labs reassuring. LFTs and lipase normal. No leukocytosis.   Patient tolerating p.o.  Abdomen is soft and nontender.  Suspect likely viral gastroenteritis versus foodborne illness.  Low suspicion for surgical pathology.  Advised p.o. hydration, antiemetics, clear liquid diet, PCP follow-up.  Return to the ED for worsening pain, fever, vomiting, any  other concerns. Final Clinical Impression(s) / ED Diagnoses Final diagnoses:  None    Rx / DC Orders ED Discharge Orders     None        Tiarra Anastacio, Jeannett Senior, MD 11/16/20 [phone]

## 2020-11-16 NOTE — ED Notes (Signed)
Pt aware of need for urine specimen, states she is unable to provide one at this time.

## 2020-11-16 NOTE — Discharge Instructions (Addendum)
Take the antinausea medication as prescribed.  Start with a clear liquid diet today and advance slowly as tolerated.  Follow-up with your doctor.  Return to the ED for worsening pain, fever, vomiting, unable to eat or drink or any other concerns.

## 2020-11-16 NOTE — ED Triage Notes (Signed)
States has been having n/v and diarrhea x 2 days .

## 2021-12-03 ENCOUNTER — Ambulatory Visit
Admission: RE | Admit: 2021-12-03 | Discharge: 2021-12-03 | Disposition: A | Discharge status: Home / Self Care | Payer: Medicaid Other | Source: Ambulatory Visit | Attending: Urgent Care | Admitting: Urgent Care

## 2021-12-03 VITALS — BP 120/45 | HR 73 | Temp 98.1°F | Resp 20

## 2021-12-03 DIAGNOSIS — L301 Dyshidrosis [pompholyx]: Secondary | ICD-10-CM

## 2021-12-03 DIAGNOSIS — H6982 Other specified disorders of Eustachian tube, left ear: Secondary | ICD-10-CM

## 2021-12-03 DIAGNOSIS — N926 Irregular menstruation, unspecified: Secondary | ICD-10-CM

## 2021-12-03 DIAGNOSIS — Z8616 Personal history of COVID-19: Secondary | ICD-10-CM

## 2021-12-03 DIAGNOSIS — N912 Amenorrhea, unspecified: Secondary | ICD-10-CM

## 2021-12-03 DIAGNOSIS — H938X2 Other specified disorders of left ear: Secondary | ICD-10-CM

## 2021-12-03 MED ORDER — LEVOCETIRIZINE DIHYDROCHLORIDE 5 MG PO TABS: 5.0000 mg | ORAL_TABLET | Freq: Every evening | ORAL | 0 refills | Status: AC

## 2021-12-03 MED ORDER — TRIAMCINOLONE ACETONIDE 0.1 % EX CREA: 1.0000 | TOPICAL_CREAM | Freq: Two times a day (BID) | CUTANEOUS | 0 refills | Status: DC

## 2021-12-03 MED ORDER — FLUTICASONE PROPIONATE 50 MCG/ACT NA SUSP: 2.0000 | Freq: Every day | NASAL | 12 refills | Status: DC

## 2021-12-03 MED ORDER — NAPROXEN 500 MG PO TABS: 500.0000 mg | ORAL_TABLET | Freq: Two times a day (BID) | ORAL | 0 refills | Status: DC

## 2021-12-03 MED ORDER — PSEUDOEPHEDRINE HCL 30 MG PO TABS: 30.0000 mg | ORAL_TABLET | Freq: Three times a day (TID) | ORAL | 0 refills | Status: DC | PRN

## 2021-12-03 NOTE — ED Triage Notes (Signed)
Pt here with painful and itchy rash on hands. Pt also here with heavy, painful period this month after not having a period for a long time after taking steroids for this rash. Pt aslo c/o left ear fullness.

## 2021-12-03 NOTE — ED Provider Notes (Signed)
[facility]   MRN: [id number] DOB: 1985/10/31  Subjective:   Robin Mcbride is a 36 y.o. female presenting for multiple chief complaints.   Rash - patient reports recurrent itchy rash over both of her hands.  She was initially treated for eczema of her hands in March with a topical steroid.  She states that this did not help as much and then worsened after she stopped using the topical steroid.  She was subsequently seen again and prescribed oral steroids.  Patient states that her symptoms improved a lot since then up until this current episode over the past week.  Reports that she washes her hands regularly and frequently.  Does not use hand sanitizer.  Feels like her hands are also dry.  Irregular cycles - patient started her cycle yesterday. Prior to that she had gone ~ 3 months without a cycle. No chance of pregnancy per patient.  No concern for STI.  Patient was previously on oral contraception and she stopped this a year ago.  She believes that her cycle was really thrown off by the prednisone course that she took in March as above.  She does have a PCP and has a follow-up coming up soon.  Denies any gynecologic history including PCOS, uterine fibroids, ovarian cyst, endometriosis.  Generally, patient was having regular cycles at the first of every month up until this past March since she stopped taking the contraception.  Her primary complaint now is having significant pelvic pains with her cycle.  Left ear problem - Patient was seen 10/21/2021.  Had a negative rapid strep test and was prescribed amoxicillin for pharyngitis. No culture was done.  However, she complains that she has had this particular symptoms since she had COVID about 6 months earlier.  No dizziness, ear drainage, tinnitus.  She is a smoker.  She has a history of allergic rhinitis and asthma.  Does not take anything consistently for these conditions.  No current facility-administered medications  for this encounter.  Current Outpatient Medications:    EPINEPHrine (EPIPEN) 0.3 mg/0.3 mL DEVI, Inject 0.3 mLs (0.3 mg total) into the muscle as needed., Disp: 1 Device, Rfl: 0   famotidine (PEPCID) 20 MG tablet, Take 1 tablet (20 mg total) by mouth 2 (two) times daily., Disp: 30 tablet, Rfl: 0   naproxen (NAPROSYN) 500 MG tablet, Take 1 tablet (500 mg total) by mouth 2 (two) times daily., Disp: 30 tablet, Rfl: 0   ondansetron (ZOFRAN ODT) 4 MG disintegrating tablet, Take 1 tablet (4 mg total) by mouth every 8 (eight) hours as needed for nausea or vomiting., Disp: 20 tablet, Rfl: 0   Allergies  Allergen Reactions   Motrin [Ibuprofen] Hives    Past Medical History:  Diagnosis Date   Anemia    Asthma    Asthma    Heart murmur      Past Surgical History:  Procedure Laterality Date   BREAST SURGERY      Family History  Problem Relation Age of Onset   Asthma Mother    Hypertension Mother     Social History   Tobacco Use   Smoking status: Every Day    Packs/day: 1.00    Types: Cigarettes   Smokeless tobacco: Never  Substance Use Topics   Alcohol use: Never   Drug use: Never    ROS   Objective:   Vitals: BP (!) 120/45   Pulse 73   Temp 98.1 F (36.7 C)  Resp 20   SpO2 98%   Physical Exam Constitutional:      General: She is not in acute distress.    Appearance: Normal appearance. She is well-developed. She is obese. She is not ill-appearing, toxic-appearing or diaphoretic.  HENT:     Head: Normocephalic and atraumatic.     Right Ear: Tympanic membrane, ear canal and external ear normal. No drainage or tenderness. No middle ear effusion. There is no impacted cerumen. Tympanic membrane is not erythematous.     Left Ear: Tympanic membrane, ear canal and external ear normal. No drainage or tenderness.  No middle ear effusion. There is no impacted cerumen. Tympanic membrane is not erythematous.     Nose: Nose normal. No congestion or rhinorrhea.      Mouth/Throat:     Mouth: Mucous membranes are moist. No oral lesions.     Pharynx: No pharyngeal swelling, oropharyngeal exudate, posterior oropharyngeal erythema or uvula swelling.     Tonsils: No tonsillar exudate or tonsillar abscesses.  Eyes:     General: No scleral icterus.       Right eye: No discharge.        Left eye: No discharge.     Extraocular Movements: Extraocular movements intact.     Right eye: Normal extraocular motion.     Left eye: Normal extraocular motion.     Conjunctiva/sclera: Conjunctivae normal.  Cardiovascular:     Rate and Rhythm: Normal rate.  Pulmonary:     Effort: Pulmonary effort is normal.  Abdominal:     General: Bowel sounds are normal. There is no distension.     Palpations: Abdomen is soft. There is no mass.     Tenderness: There is no abdominal tenderness. There is no right CVA tenderness, left CVA tenderness, guarding or rebound.  Musculoskeletal:     Cervical back: Normal range of motion and neck supple.  Lymphadenopathy:     Cervical: No cervical adenopathy.  Skin:    General: Skin is warm and dry.     Findings: Rash (Hyperpigmented papular rash over the radial aspect of her hands involving the palm, first finger, second finger) present.  Neurological:     General: No focal deficit present.     Mental Status: She is alert and oriented to person, place, and time.  Psychiatric:        Mood and Affect: Mood normal.        Behavior: Behavior normal.        Thought Content: Thought content normal.        Judgment: Judgment normal.     Assessment and Plan :   PDMP not reviewed this encounter.  1. Irregular menstrual cycle   2. Amenorrhea   3. Dyshidrotic eczema   4. Ear fullness, left   5. Eustachian tube dysfunction, left   6. History of COVID-19    I advised against using any particular medication to stop her current cycle as she is finally on track.  She has previously taken naproxen without any issues.  I counseled that they  would be important for her to pursue her general gynecologic checkup which we are not equipped to do here, specifically her Pap smear.  Also advised that it might be beneficial to pursue pelvic and transvaginal ultrasound to rule out any other etiologies that could affect her cycles and menstrual cramping.  For her dyshidrotic eczema I provided general counseling and management.  I advised that we should avoid oral steroids as she feels  that this affected her dramatically and her current symptoms are mild.  Advised that she start triamcinolone cream.  Discussed appropriate use of said steroid cream.  For her left ear fullness, I suspect allergic rhinitis, eustachian tube dysfunction.  She has had the symptoms for months and there is no sign of bacterial infection.  Recommended that she start triple therapy with Flonase, Xyzal and pseudoephedrine.  Smoking does affect this as well and we went over that too.   Follow-up with PCP as planned.  Counseled patient on potential for adverse effects with medications prescribed/recommended today, ER and return-to-clinic precautions discussed, patient verbalized understanding.    Wallis Bamberg, PA-C 12/03/21 1145

## 2022-01-12 ENCOUNTER — Ambulatory Visit
Admission: EM | Admit: 2022-01-12 | Discharge: 2022-01-12 | Disposition: A | Discharge status: Home / Self Care | Payer: Medicaid Other | Attending: Emergency Medicine | Admitting: Emergency Medicine

## 2022-01-12 DIAGNOSIS — L309 Dermatitis, unspecified: Secondary | ICD-10-CM

## 2022-01-12 DIAGNOSIS — F429 Obsessive-compulsive disorder, unspecified: Secondary | ICD-10-CM

## 2022-01-12 MED ORDER — TRIAMCINOLONE ACETONIDE 0.1 % EX CREA: 1.0000 | TOPICAL_CREAM | Freq: Two times a day (BID) | CUTANEOUS | 1 refills | Status: DC

## 2022-01-12 MED ORDER — SERTRALINE HCL 25 MG PO TABS: 25.0000 mg | ORAL_TABLET | Freq: Every day | ORAL | 1 refills | Status: AC

## 2022-01-12 NOTE — Discharge Instructions (Addendum)
Please resume taking Zoloft as has previously been prescribed by primary care provider.  Please take 1 tablet daily whether you are experiencing symptoms of anxiety or compulsive cleaning or not.  This medication only works if you take it daily.  I recommend that you continue this medication for at least 3 months before you discontinue it.  Please speak with your primary care provider before you discontinue it.  For the rash in your hands, please apply triamcinolone cream twice daily.  Once the triamcinolone cream has been absorbed into your hands, please apply Eucerin original healing cream to your hands to create a moisture barrier which will seal in the steroid to keep your skin calm.  When you clean, please be sure that you are wearing protective gloves to prevent injury to your skin.  Please follow-up with your primary care provider about this visit as soon as possible.  Thank you for visiting urgent care.

## 2022-01-12 NOTE — ED Provider Notes (Signed)
[facility]    CSN: [id number] Arrival date & time: 01/12/22  1100    HISTORY   Chief Complaint  Patient presents with   Rash   HPI Robin Mcbride is a pleasant, 36 y.o. female who presents to urgent care today. Patient reports a history of OCD and anxiety, has been prescribed Zoloft for this in the past but is not currently taking it, states she thinks her prescription has run out.  Patient states she continues to clean her home excessively, often using caustic bleaching products.  Patient states she does not wear gloves when doing so, continues to have rash on her hands that is itchy, burning, red.  Patient states that she has tried applying lotion to her hands which makes the skin burn more, states she was provided with a prescription for triamcinolone cream last month but her 30 g tube has run out.  Patient states triamcinolone cream is very helpful.  The history is provided by the patient.   Past Medical History:  Diagnosis Date   Anemia    Asthma    Asthma    Heart murmur    There are no problems to display for this patient.  Past Surgical History:  Procedure Laterality Date   BREAST SURGERY     OB History   No obstetric history on file.    Home Medications    Prior to Admission medications   Medication Sig Start Date End Date Taking? Authorizing Provider  sertraline (ZOLOFT) 25 MG tablet Take 1 tablet (25 mg total) by mouth daily. 01/12/22 07/11/22 Yes Lynden Oxford Scales, PA-C  triamcinolone cream (KENALOG) 0.1 % Apply 1 Application topically 2 (two) times daily. Apply to hands twice daily, do not apply to face. 01/12/22  Yes Lynden Oxford Scales, PA-C  EPINEPHrine (EPIPEN) 0.3 mg/0.3 mL DEVI Inject 0.3 mLs (0.3 mg total) into the muscle as needed. 03/25/11   Teressa Lower, MD  famotidine (PEPCID) 20 MG tablet Take 1 tablet (20 mg total) by mouth 2 (two) times daily. 03/25/11 03/24/12  Teressa Lower, MD  fluticasone (FLONASE) 50 MCG/ACT nasal spray  Place 2 sprays into both nostrils daily. 12/03/21   Jaynee Eagles, PA-C  levocetirizine (XYZAL) 5 MG tablet Take 1 tablet (5 mg total) by mouth every evening. 12/03/21   Jaynee Eagles, PA-C  naproxen (NAPROSYN) 500 MG tablet Take 1 tablet (500 mg total) by mouth 2 (two) times daily with a meal. 12/03/21   Jaynee Eagles, PA-C  ondansetron (ZOFRAN ODT) 4 MG disintegrating tablet Take 1 tablet (4 mg total) by mouth every 8 (eight) hours as needed for nausea or vomiting. 11/16/20   Rancour, Annie Main, MD  pseudoephedrine (SUDAFED) 30 MG tablet Take 1 tablet (30 mg total) by mouth every 8 (eight) hours as needed for congestion. 12/03/21   Jaynee Eagles, PA-C    Family History Family History  Problem Relation Age of Onset   Asthma Mother    Hypertension Mother    Social History Social History   Tobacco Use   Smoking status: Every Day    Packs/day: 1.00    Types: Cigarettes   Smokeless tobacco: Never  Substance Use Topics   Alcohol use: Never   Drug use: Never   Allergies   Motrin [ibuprofen]  Review of Systems Review of Systems Pertinent findings revealed after performing a 14 point review of systems has been noted in the history of present illness.  Physical Exam Triage Vital Signs ED Triage Vitals  Enc Vitals  Group     BP 02/25/21 0827 (!) 147/82     Pulse Rate 02/25/21 0827 72     Resp 02/25/21 0827 18     Temp 02/25/21 0827 98.3 F (36.8 C)     Temp Source 02/25/21 0827 Oral     SpO2 02/25/21 0827 98 %     Weight --      Height --      Head Circumference --      Peak Flow --      Pain Score 02/25/21 0826 5     Pain Loc --      Pain Edu? --      Excl. in GC? --   No data found.  Updated Vital Signs BP 110/68 (BP Location: Left Arm)   Pulse 85   Temp 97.9 F (36.6 C) (Oral)   Resp 18   LMP 12/30/2021   SpO2 97%   Physical Exam Vitals and nursing note reviewed.  Constitutional:      General: She is not in acute distress.    Appearance: Normal appearance.  HENT:     Head:  Normocephalic and atraumatic.  Eyes:     Pupils: Pupils are equal, round, and reactive to light.  Cardiovascular:     Rate and Rhythm: Normal rate and regular rhythm.  Pulmonary:     Effort: Pulmonary effort is normal.     Breath sounds: Normal breath sounds.  Musculoskeletal:        General: Normal range of motion.     Cervical back: Normal range of motion and neck supple.  Skin:    General: Skin is warm and dry.     Findings: Rash (Erythematous rash on palms of both hands without signs of drainage, scale or excoriation) present.  Neurological:     General: No focal deficit present.     Mental Status: She is alert and oriented to person, place, and time. Mental status is at baseline.  Psychiatric:        Attention and Perception: Attention and perception normal.        Mood and Affect: Mood and affect normal.        Speech: Speech normal.        Behavior: Behavior normal.        Thought Content: Thought content normal.        Cognition and Memory: Cognition and memory normal.        Judgment: Judgment normal.     Visual Acuity Right Eye Distance:   Left Eye Distance:   Bilateral Distance:    Right Eye Near:   Left Eye Near:    Bilateral Near:     UC Couse / Diagnostics / Procedures:     Radiology No results found.  Procedures Procedures (including critical care time) EKG  Pending results:  Labs Reviewed - No data to display  Medications Ordered in UC: Medications - No data to display  UC Diagnoses / Final Clinical Impressions(s)   I have reviewed the triage vital signs and the nursing notes.  Pertinent labs & imaging results that were available during my care of the patient were reviewed by me and considered in my medical decision making (see chart for details).    Final diagnoses:  Obsessive-compulsive disorder, unspecified type  Acute hand eczema   Patient provided with a renewal of her Zoloft as well as the triamcinolone cream.  Patient also advised  to use Eucerin cream to keep hands well moisturized  and to wear gloves when cleaning.  Patient advised to follow-up with PCP soon as possible regarding both issues.  ED Prescriptions     Medication Sig Dispense Auth. Provider   sertraline (ZOLOFT) 25 MG tablet Take 1 tablet (25 mg total) by mouth daily. 90 tablet Lynden Oxford Scales, PA-C   triamcinolone cream (KENALOG) 0.1 % Apply 1 Application topically 2 (two) times daily. Apply to hands twice daily, do not apply to face. 453.6 g Lynden Oxford Scales, PA-C      PDMP not reviewed this encounter.  Pending results:  Labs Reviewed - No data to display  Discharge Instructions:   Discharge Instructions      Please resume taking Zoloft as has previously been prescribed by primary care provider.  Please take 1 tablet daily whether you are experiencing symptoms of anxiety or compulsive cleaning or not.  This medication only works if you take it daily.  I recommend that you continue this medication for at least 3 months before you discontinue it.  Please speak with your primary care provider before you discontinue it.  For the rash in your hands, please apply triamcinolone cream twice daily.  Once the triamcinolone cream has been absorbed into your hands, please apply Eucerin original healing cream to your hands to create a moisture barrier which will seal in the steroid to keep your skin calm.  When you clean, please be sure that you are wearing protective gloves to prevent injury to your skin.  Please follow-up with your primary care provider about this visit as soon as possible.  Thank you for visiting urgent care.    Disposition Upon Discharge:  Condition: stable for discharge home  Patient presented with an acute illness with associated systemic symptoms and significant discomfort requiring urgent management. In my opinion, this is a condition that a prudent lay person (someone who possesses an average knowledge of health and  medicine) may potentially expect to result in complications if not addressed urgently such as respiratory distress, impairment of bodily function or dysfunction of bodily organs.   Routine symptom specific, illness specific and/or disease specific instructions were discussed with the patient and/or caregiver at length.   As such, the patient has been evaluated and assessed, work-up was performed and treatment was provided in alignment with urgent care protocols and evidence based medicine.  Patient/parent/caregiver has been advised that the patient may require follow up for further testing and treatment if the symptoms continue in spite of treatment, as clinically indicated and appropriate.  Patient/parent/caregiver has been advised to return to the [facility] or PCP if no better; to PCP or the Emergency Department if new signs and symptoms develop, or if the current signs or symptoms continue to change or worsen for further workup, evaluation and treatment as clinically indicated and appropriate  The patient will follow up with their current PCP if and as advised. If the patient does not currently have a PCP we will assist them in obtaining one.   The patient may need specialty follow up if the symptoms continue, in spite of conservative treatment and management, for further workup, evaluation, consultation and treatment as clinically indicated and appropriate.   Patient/parent/caregiver verbalized understanding and agreement of plan as discussed.  All questions were addressed during visit.  Please see discharge instructions below for further details of plan.  This office note has been dictated using Museum/gallery curator.  Unfortunately, this method of dictation can sometimes lead to typographical or grammatical errors.  I  apologize for your inconvenience in advance if this occurs.  Please do not hesitate to reach out to me if clarification is needed.      Theadora Rama Scales,  PA-C 01/12/22 1209

## 2022-01-12 NOTE — ED Triage Notes (Signed)
The patient states she has rashes (itchiness, and burning) to her hands.the patient states she ran out of the cream she was prescribed last month.   Home interventions: benadryl

## 2022-01-16 ENCOUNTER — Other Ambulatory Visit: Payer: Self-pay

## 2022-01-16 ENCOUNTER — Emergency Department (HOSPITAL_BASED_OUTPATIENT_CLINIC_OR_DEPARTMENT_OTHER): Payer: Medicaid Other

## 2022-01-16 ENCOUNTER — Encounter (HOSPITAL_BASED_OUTPATIENT_CLINIC_OR_DEPARTMENT_OTHER): Payer: Self-pay | Admitting: Emergency Medicine

## 2022-01-16 ENCOUNTER — Emergency Department (HOSPITAL_BASED_OUTPATIENT_CLINIC_OR_DEPARTMENT_OTHER)
Admission: EM | Admit: 2022-01-16 | Discharge: 2022-01-16 | Disposition: A | Discharge status: Home / Self Care | Payer: Medicaid Other | Attending: Emergency Medicine | Admitting: Emergency Medicine

## 2022-01-16 DIAGNOSIS — K219 Gastro-esophageal reflux disease without esophagitis: Secondary | ICD-10-CM | POA: Diagnosis not present

## 2022-01-16 DIAGNOSIS — R0789 Other chest pain: Secondary | ICD-10-CM | POA: Diagnosis present

## 2022-01-16 DIAGNOSIS — K209 Esophagitis, unspecified without bleeding: Secondary | ICD-10-CM

## 2022-01-16 MED ORDER — OMEPRAZOLE 20 MG PO CPDR: 20.0000 mg | DELAYED_RELEASE_CAPSULE | Freq: Every day | ORAL | 0 refills | Status: DC

## 2022-01-16 MED ORDER — SUCRALFATE 1 G PO TABS: 1.0000 g | ORAL_TABLET | Freq: Three times a day (TID) | ORAL | 0 refills | Status: DC

## 2022-01-16 MED ORDER — SUCRALFATE 1 GM/10ML PO SUSP
1.0000 g | Freq: Once | ORAL | Status: AC
Start: 2022-01-16 — End: 2022-01-16
  Administered 2022-01-16: 1 g via ORAL
  Filled 2022-01-16: qty 10

## 2022-01-16 MED ORDER — ALUM & MAG HYDROXIDE-SIMETH 200-200-20 MG/5ML PO SUSP
30.0000 mL | Freq: Once | ORAL | Status: AC
  Administered 2022-01-16: 30 mL via ORAL
  Filled 2022-01-16: qty 30

## 2022-01-16 NOTE — ED Provider Notes (Signed)
[facility] Provider Note   CSN: [id number] Arrival date & time: 01/16/22  1601     History  Chief Complaint  Patient presents with   Chest Wall Pain    Robin Mcbride is a 36 y.o. female.  HPI 36 year old female presents to the ER with complaints of pain with swallowing.  Has a history of bad reflux, had an episode of choking several days ago and has no developed pain with swallowing which is deterring her from eating.  She also endorses burning.  She also had some episodes of vomiting.  Denies any abdominal pain.  Denies any shortness of breath or chest pain.  She has not taken any over-the-counter medicines for this.  Denies any fevers or chills.    Home Medications Prior to Admission medications   Medication Sig Start Date End Date Taking? Authorizing Provider  omeprazole (PRILOSEC) 20 MG capsule Take 1 capsule (20 mg total) by mouth daily. 01/16/22  Yes Garald Balding, PA-C  sucralfate (CARAFATE) 1 g tablet Take 1 tablet (1 g total) by mouth 4 (four) times daily -  with meals and at bedtime. 01/16/22  Yes Garald Balding, PA-C  EPINEPHrine (EPIPEN) 0.3 mg/0.3 mL DEVI Inject 0.3 mLs (0.3 mg total) into the muscle as needed. 03/25/11   Teressa Lower, MD  famotidine (PEPCID) 20 MG tablet Take 1 tablet (20 mg total) by mouth 2 (two) times daily. 03/25/11 03/24/12  Teressa Lower, MD  fluticasone (FLONASE) 50 MCG/ACT nasal spray Place 2 sprays into both nostrils daily. 12/03/21   Jaynee Eagles, PA-C  levocetirizine (XYZAL) 5 MG tablet Take 1 tablet (5 mg total) by mouth every evening. 12/03/21   Jaynee Eagles, PA-C  naproxen (NAPROSYN) 500 MG tablet Take 1 tablet (500 mg total) by mouth 2 (two) times daily with a meal. 12/03/21   Jaynee Eagles, PA-C  ondansetron (ZOFRAN ODT) 4 MG disintegrating tablet Take 1 tablet (4 mg total) by mouth every 8 (eight) hours as needed for nausea or vomiting. 11/16/20   Rancour, Annie Main, MD  pseudoephedrine (SUDAFED) 30 MG tablet Take  1 tablet (30 mg total) by mouth every 8 (eight) hours as needed for congestion. 12/03/21   Jaynee Eagles, PA-C  sertraline (ZOLOFT) 25 MG tablet Take 1 tablet (25 mg total) by mouth daily. 01/12/22 07/11/22  Lynden Oxford Scales, PA-C  triamcinolone cream (KENALOG) 0.1 % Apply 1 Application topically 2 (two) times daily. Apply to hands twice daily, do not apply to face. 01/12/22   Lynden Oxford Scales, PA-C      Allergies    Motrin [ibuprofen]    Review of Systems   Review of Systems Ten systems reviewed and are negative for acute change, except as noted in the HPI.   Physical Exam Updated Vital Signs BP 115/83   Pulse 88   Temp 97.9 F (36.6 C) (Oral)   Resp 18   LMP 12/30/2021   SpO2 98%  Physical Exam Vitals and nursing note reviewed.  Constitutional:      General: She is not in acute distress.    Appearance: She is well-developed.  HENT:     Head: Normocephalic and atraumatic.  Eyes:     Conjunctiva/sclera: Conjunctivae normal.  Cardiovascular:     Rate and Rhythm: Normal rate and regular rhythm.     Heart sounds: No murmur heard. Pulmonary:     Effort: Pulmonary effort is normal. No respiratory distress.     Breath sounds: Normal breath sounds.  Abdominal:     Palpations: Abdomen is soft.     Tenderness: There is no abdominal tenderness.  Musculoskeletal:        General: No swelling.     Cervical back: Neck supple.  Skin:    General: Skin is warm and dry.     Capillary Refill: Capillary refill takes less than 2 seconds.  Neurological:     Mental Status: She is alert.  Psychiatric:        Mood and Affect: Mood normal.     ED Results / Procedures / Treatments   Labs (all labs ordered are listed, but only abnormal results are displayed) Labs Reviewed - No data to display  EKG None  Radiology DG Chest 2 View  Result Date: 01/16/2022 CLINICAL DATA:  Chest pain.  Choking on food earlier. EXAM: CHEST - 2 VIEW COMPARISON:  Chest x-ray 07/23/2010 FINDINGS:  Patchy airspace opacity in the right lung could reflect atelectasis or an early infiltrate. No pleural effusions or pulmonary lesions. The bony thorax is intact. IMPRESSION: Right lower lobe atelectasis versus early infiltrate. Electronically Signed   By: Rudie Meyer M.D.   On: 01/16/2022 16:34    Procedures Procedures    Medications Ordered in ED Medications  alum & mag hydroxide-simeth (MAALOX/MYLANTA) 200-200-20 MG/5ML suspension 30 mL (30 mLs Oral Given 01/16/22 1751)  sucralfate (CARAFATE) 1 GM/10ML suspension 1 g (1 g Oral Given 01/16/22 1751)    ED Course/ Medical Decision Making/ A&P                           Medical Decision Making Amount and/or Complexity of Data Reviewed Radiology: ordered.  Risk OTC drugs. Prescription drug management.   34 with female presenting with pain with swallowing.  Denies foreign body sensation.  Otherwise well-appearing.  Vitals reassuring.  Physical exam benign.  DDx includes reflux, esophagitis, ACS, foreign body, PE, pneumonia  Chest x-ray reviewed, agree with radiology read, with question atelectasis versus pneumonia, however the patient denies any symptoms.  EKG reviewed, without ischemic changes.  Suspect reflux versus esophagitis.  I have low suspicion for ACS at this time.  The patient denies any respiratory symptoms and I have low suspicion for a acute pneumonia.  No evidence of esophageal food impaction, patient tolerating fluids well.  Will treat with Maalox and Carafate, sent home with omeprazole and Carafate.  We will refer her to GI for further evaluation.  We discussed return precautions.  She voiced understanding and is agreeable.  Stable for discharge. Final Clinical Impression(s) / ED Diagnoses Final diagnoses:  Acute esophagitis  Gastric reflux    Rx / DC Orders ED Discharge Orders          Ordered    omeprazole (PRILOSEC) 20 MG capsule  Daily        01/16/22 1741    sucralfate (CARAFATE) 1 g tablet  3 times daily with  meals & bedtime        01/16/22 1741              Mare Ferrari, PA-C 01/16/22 1759    Derwood Kaplan, MD 01/17/22 1612

## 2022-01-16 NOTE — ED Triage Notes (Addendum)
Patient presents to ED via POV from home. Here with right chest wall pain. Patient states "I have a lot of acid and went I swallow my chest hurts right here". Airway is patent. Even and non labored respirations noted. Patient reports choking on food and having these issues since. Denies sore throat.

## 2022-01-16 NOTE — Discharge Instructions (Signed)
You were evaluated in the Emergency Department and after careful evaluation, we did not find any emergent condition requiring admission or further testing in the hospital.  I suspect that you have something called esophagitis which is an inflammation of the esophagus.  Please take omeprazole and Carafate for this.  In regards to your reflux, I do heavily recommend that you follow-up with a gastrointestinal (stomach) doctor.  Please see the handout for food choices for gastric reflux disease.  I have provided their phone number in your discharge paperwork, please call and schedule an appointment.  Please return to the ER for any new or worsening symptoms.  Please return to the Emergency Department if you experience any worsening of your condition.  We encourage you to follow up with a primary care provider.  Thank you for allowing [place] to be a part of your care.

## 2022-03-14 ENCOUNTER — Ambulatory Visit
Admission: EM | Admit: 2022-03-14 | Discharge: 2022-03-14 | Disposition: A | Discharge status: Home / Self Care | Payer: Medicaid Other | Attending: Emergency Medicine | Admitting: Emergency Medicine

## 2022-03-14 ENCOUNTER — Ambulatory Visit (INDEPENDENT_AMBULATORY_CARE_PROVIDER_SITE_OTHER): Payer: Medicaid Other

## 2022-03-14 DIAGNOSIS — M4186 Other forms of scoliosis, lumbar region: Secondary | ICD-10-CM

## 2022-03-14 DIAGNOSIS — M545 Low back pain, unspecified: Secondary | ICD-10-CM

## 2022-03-14 DIAGNOSIS — G8929 Other chronic pain: Secondary | ICD-10-CM | POA: Diagnosis not present

## 2022-03-14 MED ORDER — DICLOFENAC SODIUM 1 % EX GEL: 4.0000 g | Freq: Four times a day (QID) | CUTANEOUS | 2 refills | Status: DC

## 2022-03-14 MED ORDER — BACLOFEN 10 MG PO TABS: 10.0000 mg | ORAL_TABLET | Freq: Every day | ORAL | 0 refills | Status: AC

## 2022-03-14 MED ORDER — ACETAMINOPHEN 325 MG PO TABS
975.0000 mg | ORAL_TABLET | Freq: Once | ORAL | Status: AC
  Administered 2022-03-14: 975 mg via ORAL

## 2022-03-14 MED ORDER — NAPROXEN 500 MG PO TABS: 500.0000 mg | ORAL_TABLET | Freq: Two times a day (BID) | ORAL | 0 refills | Status: DC

## 2022-03-14 MED ORDER — ACETAMINOPHEN 500 MG PO TABS: 1000.0000 mg | ORAL_TABLET | Freq: Three times a day (TID) | ORAL | 0 refills | Status: AC

## 2022-03-14 MED ORDER — METHYLPREDNISOLONE SODIUM SUCC 125 MG IJ SOLR
125.0000 mg | Freq: Once | INTRAMUSCULAR | Status: AC
Start: 2022-03-14 — End: 2022-03-14
  Administered 2022-03-14: 125 mg via INTRAMUSCULAR
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
## 2022-03-14 NOTE — ED Triage Notes (Signed)
Pt states she woke yesterday am with lower back pain-denies injury-no relief with tylenol-NAD-slow gait

## 2022-03-14 NOTE — Discharge Instructions (Addendum)
You have some curvature in the upper part of your lumbar spine, L3.  This is likely the cause of your recurrent mid and lower back pain that has been going on since 2016 according to your electronic medical records.  I have enclosed some information about lower back pain that I hope you find helpful.  The mainstay of therapy for musculoskeletal pain is reduction of inflammation and relaxation of tension which is causing inflammation.  Keep in mind, pain always begets more pain.  To help you stay ahead of your pain and inflammation, I have provided the following regimen for you:   During your visit today, you received an injection of ketorolac, high-dose nonsteroidal anti-inflammatory pain medication that should significantly reduce your pain for the next 6 to 8 hours.    You were also provided with 975 mg of Tylenol during your visit today.  Please continue taking Tylenol 1000 mg every 8 hours.   This evening, please begin taking baclofen 10 mg.  This is a highly effective muscle relaxer and antispasmodic which should continue to provide you with relaxation of your tense muscles, allow you to sleep well and to keep your pain under control.   Tomorrow morning, please begin taking naproxen 500 mg twice daily.  Please keep in mind that it is always easier to treat a little bit of pain that is to treat a lot of pain.  I recommend that for the next several days, you take this medication on a scheduled basis.  After that, take it when you begin to feel the pain returning, do not wait until you are in a lot of pain.   During the day, please set aside time to apply ice to the affected area 4 times daily for 20 minutes each application.  This can be achieved by using a bag of frozen peas or corn, a Ziploc bag filled with ice and water, or Ziploc bag filled with half rubbing alcohol and half Dawn dish detergent, frozen into a slush.  Please be careful not to apply ice directly to your skin, always place a soft  cloth between you and the ice pack.   You are welcome to use topical anti-inflammatory creams such as Voltaren gel, capsaicin or Aspercreme as recommended.  These medications are available over-the-counter, please follow manufactures instructions for use.  As a courtesy, I provided you with a prescription for diclofenac in the event that your insurance will pay for this.   Please consider discussing referral to physical therapy with your primary care provider.  Physical therapist are very good at teasing out the underlying cause of acute lower back pain and helping with prevention of future recurrences.   Please avoid attempts to stretch or strengthen the affected area until you are feeling completely pain-free.  Attempts to do so will only prolong the healing process.   I also recommend that you remain out of work for the next several days, I provided you with a note to return to work in 3 days.  If you feel that you need this time extended, please follow-up with your primary care provider or return to urgent care for reevaluation so that we can provide you with a note for another 3 days.   Thank you for visiting urgent care today.  We appreciate the opportunity to participate in your care.

## 2022-03-20 ENCOUNTER — Telehealth: Payer: Self-pay

## 2022-03-20 NOTE — Telephone Encounter (Signed)
Received call from pt concerning a referral being place for chronic back pain. Attempted to return patient call-no answer and voice mail is full.

## 2022-03-25 IMAGING — CR DG FINGER RING 2+V*R*
3 series · 3 of 3 positions shown · non-contrast
Comparison: None.

CLINICAL DATA: Injury

EXAM:
RIGHT RING FINGER 2+V

[finger ap]
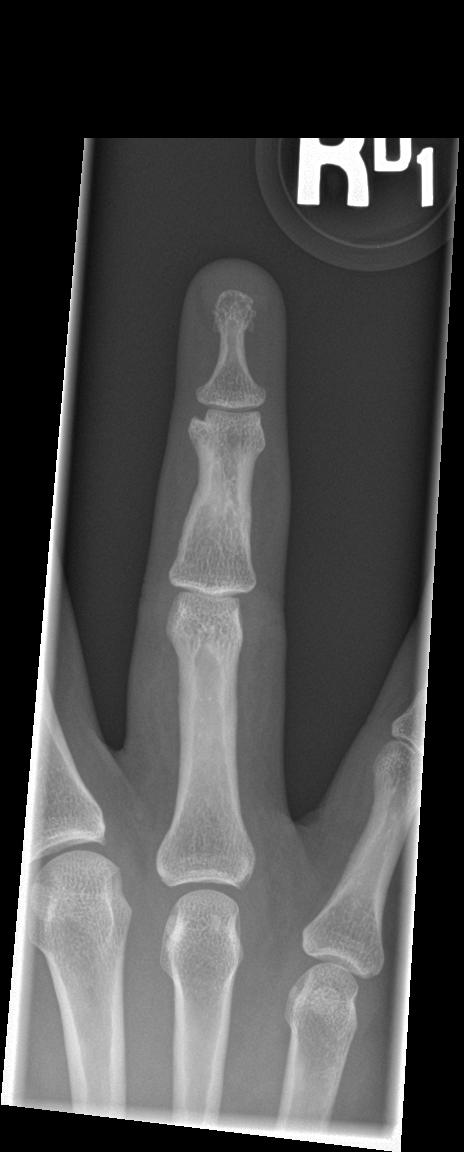

[finger obl]
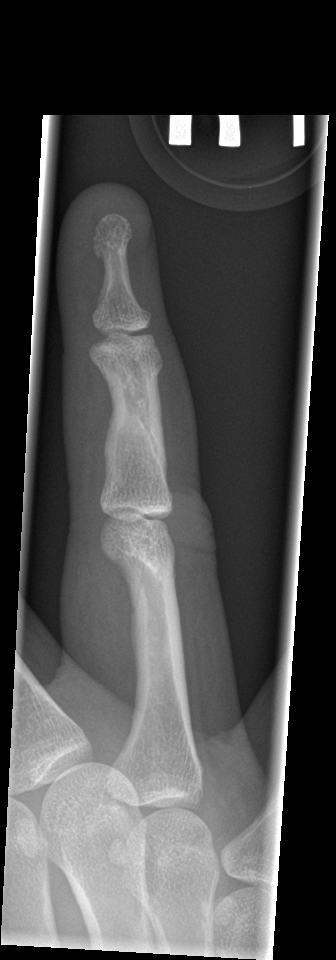

[finger lat]
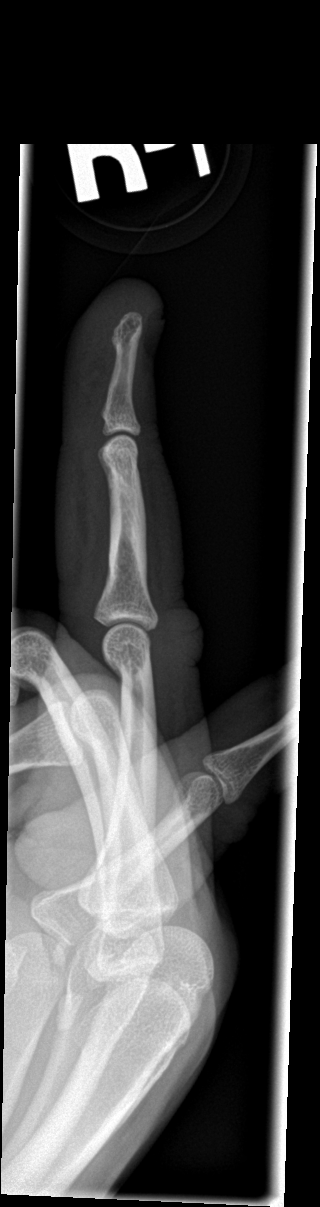

[3 of 3 positions shown; findings below may reference images not displayed]

FINDINGS: No acute fracture or dislocation of the ring finger. There appears
to have been a prior, well callused oblique fracture of the middle
phalanx. Joint spaces are preserved. Soft tissue edema.
IMPRESSION: 1. No acute fracture or dislocation of the ring finger. There
appears to have been a prior, well callused oblique fracture of the
middle phalanx.

2.  Soft tissue edema.

## 2022-05-10 ENCOUNTER — Emergency Department (HOSPITAL_BASED_OUTPATIENT_CLINIC_OR_DEPARTMENT_OTHER)
Admission: EM | Admit: 2022-05-10 | Discharge: 2022-05-11 | Disposition: A | Discharge status: Home / Self Care | Payer: Medicaid Other | Attending: Emergency Medicine | Admitting: Emergency Medicine

## 2022-05-10 ENCOUNTER — Emergency Department (HOSPITAL_BASED_OUTPATIENT_CLINIC_OR_DEPARTMENT_OTHER): Payer: Medicaid Other

## 2022-05-10 ENCOUNTER — Other Ambulatory Visit: Payer: Self-pay

## 2022-05-10 ENCOUNTER — Encounter (HOSPITAL_BASED_OUTPATIENT_CLINIC_OR_DEPARTMENT_OTHER): Payer: Self-pay | Admitting: Urology

## 2022-05-10 DIAGNOSIS — G8929 Other chronic pain: Secondary | ICD-10-CM | POA: Insufficient documentation

## 2022-05-10 DIAGNOSIS — R0781 Pleurodynia: Secondary | ICD-10-CM | POA: Insufficient documentation

## 2022-05-10 DIAGNOSIS — R519 Headache, unspecified: Secondary | ICD-10-CM | POA: Diagnosis present

## 2022-05-10 DIAGNOSIS — M545 Low back pain, unspecified: Secondary | ICD-10-CM | POA: Diagnosis not present

## 2022-05-10 MED ORDER — OXYCODONE HCL 5 MG PO TABS
5.0000 mg | ORAL_TABLET | Freq: Once | ORAL | Status: AC
Start: 2022-05-10 — End: 2022-05-11
  Administered 2022-05-11: 5 mg via ORAL
  Filled 2022-05-10: qty 1

## 2022-05-10 MED ORDER — ACETAMINOPHEN 500 MG PO TABS
1000.0000 mg | ORAL_TABLET | Freq: Once | ORAL | Status: AC
  Administered 2022-05-11: 1000 mg via ORAL
  Filled 2022-05-10: qty 2
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
## 2022-05-11 ENCOUNTER — Emergency Department (HOSPITAL_BASED_OUTPATIENT_CLINIC_OR_DEPARTMENT_OTHER): Payer: Medicaid Other

## 2022-05-11 LAB — PREGNANCY, URINE: Preg Test, Ur: NEGATIVE
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
## 2022-05-11 NOTE — Discharge Instructions (Signed)
You did not have any broken bones in your face or any bleeding inside the skull.  Please follow up with your family doctor in the office.   Take 4 over the counter ibuprofen tablets 3 times a day or 2 over-the-counter naproxen tablets twice a day for pain. Also take tylenol 1000mg (2 extra strength) four times a day.

## 2022-07-19 ENCOUNTER — Ambulatory Visit
Admission: EM | Admit: 2022-07-19 | Discharge: 2022-07-19 | Disposition: A | Discharge status: Home / Self Care | Payer: Medicaid Other | Attending: Urgent Care | Admitting: Urgent Care

## 2022-07-19 DIAGNOSIS — L301 Dyshidrosis [pompholyx]: Secondary | ICD-10-CM

## 2022-07-19 DIAGNOSIS — K5900 Constipation, unspecified: Secondary | ICD-10-CM | POA: Diagnosis not present

## 2022-07-19 DIAGNOSIS — R109 Unspecified abdominal pain: Secondary | ICD-10-CM | POA: Diagnosis not present

## 2022-07-19 MED ORDER — OMEPRAZOLE 20 MG PO CPDR: 20.0000 mg | DELAYED_RELEASE_CAPSULE | Freq: Every day | ORAL | 0 refills | Status: DC

## 2022-07-19 MED ORDER — DOCUSATE SODIUM 100 MG PO CAPS: 100.0000 mg | ORAL_CAPSULE | Freq: Two times a day (BID) | ORAL | 0 refills | Status: AC

## 2022-07-19 MED ORDER — FAMOTIDINE 20 MG PO TABS: 20.0000 mg | ORAL_TABLET | Freq: Two times a day (BID) | ORAL | 0 refills | Status: AC

## 2022-07-19 MED ORDER — TRIAMCINOLONE ACETONIDE 0.1 % EX CREA: TOPICAL_CREAM | CUTANEOUS | 1 refills | Status: DC

## 2022-07-19 NOTE — Discharge Instructions (Addendum)
For moderate to severe constipation (not having a bowel movement in more than 3 days) then try to use an enema or Miralax once daily until you have a good bowel movement.  It is not a good idea to use an enema or laxatives daily. If you find you are doing this, then please follow up with a gastroenterologist. Otherwise, a medication you could use daily to help with promoting bowel movements is docusate (Colace) 100mg. It is okay to use this 1-2 times daily as a stool softener.  Try to stay active physically including regular exercise 2-3 times a week.  Make sure you hydrate well every day with about 64 ounces of water daily (that is 2 liters).  Try to avoid carb heavy foods, dairy. This includes cutting out breads, pasta, pizza, pastries, potatoes, rice, starchy foods in general. Eat more fiber as listed below: ° °Salads - kale, spinach, cabbage, spring mix, arugula °Fruits - avocadoes, berries (blueberries, raspberries, blackberries), apples, oranges, pomegranate, grapefruit, kiwi °Vegetables - asparagus, cauliflower, broccoli, green beans, brussel sprouts, bell peppers, beets; stay away from or limit starchy vegetables like potatoes, carrots, peas °Other general foods - kidney beans, egg whites, almonds, walnuts, sunflower seeds, pumpkin seeds, fat free yogurt, almond milk, flax seeds, quinoa, oats  °Meat - It is better to eat lean meats and limit your red meat including pork to once a week.  Wild caught fish, chicken breast are good options as they tend to be leaner sources of good protein. Still be mindful of the sodium labels for the meats you buy. ° °DO NOT EAT ANY FOODS ON THIS LIST THAT YOU ARE ALLERGIC TO. For more specific needs, I highly recommend consulting a dietician or nutritionist but this can definitely be a good starting point. ° °

## 2022-07-19 NOTE — ED Triage Notes (Signed)
Pt reports constipation for several days. Pt reports her acid reflux is messing up. Pt reports her last dose of omeprazole was yesterday.

## 2022-07-19 NOTE — ED Provider Notes (Signed)
[facility] - URGENT CARE CENTER  Note:  This document was prepared using Systems analyst and may include unintentional dictation errors.  MRN: [id number] DOB: 18-May-1985  Subjective:   Robin Mcbride is a 37 y.o. female presenting for 4 to 5-day history of acute onset persistent constipation, perianal/rectal pain, straining to defecate.  Last bowel movement was this morning.  Patient reports that she had very hard stool and had to strain very much.  In the past days she did end up having a lot of upper abdominal pain, acid reflux and ended up vomiting which has worsened her reflux.  Needs a refill of her omeprazole.  Is not taking famotidine.  Regarding her diet, admits that it does not include much fiber or water. No fever, bloody stools, recent antibiotic use, hospitalizations or long distance travel.  Has not eaten raw foods, drank unfiltered water.  No history of GI disorders including Crohn's, IBS, ulcerative colitis.  Patient would also like to be rechecked on her dyshidrotic eczema.  She still has the irritated rash.  Has used the cream as directed.  Unfortunately, she has not been able to keep her hands from staying wet.  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  No current facility-administered medications for this encounter.  Current Outpatient Medications:    omeprazole (PRILOSEC) 20 MG capsule, Take 1 capsule (20 mg total) by mouth daily., Disp: 30 capsule, Rfl: 0   diclofenac Sodium (VOLTAREN) 1 % GEL, Apply 4 g topically 4 (four) times daily. Apply to affected areas 4 times daily as needed for pain., Disp: 100 g, Rfl: 2   famotidine (PEPCID) 20 MG tablet, Take 1 tablet (20 mg total) by mouth 2 (two) times daily., Disp: 30 tablet, Rfl: 0   fluticasone (FLONASE) 50 MCG/ACT nasal spray, Place 2 sprays into both nostrils daily., Disp: 16 g, Rfl: 12   levocetirizine (XYZAL) 5 MG tablet, Take 1 tablet (5 mg total) by mouth  every evening., Disp: 90 tablet, Rfl: 0   naproxen (NAPROSYN) 500 MG tablet, Take 1 tablet (500 mg total) by mouth 2 (two) times daily., Disp: 30 tablet, Rfl: 0   sertraline (ZOLOFT) 25 MG tablet, Take 1 tablet (25 mg total) by mouth daily., Disp: 90 tablet, Rfl: 1   triamcinolone cream (KENALOG) 0.1 %, Apply 1 Application topically 2 (two) times daily. Apply to hands twice daily, do not apply to face., Disp: 453.6 g, Rfl: 1   Allergies  Allergen Reactions   Motrin [Ibuprofen] Hives    Past Medical History:  Diagnosis Date   Anemia    Asthma    Asthma    Heart murmur      Past Surgical History:  Procedure Laterality Date   BREAST SURGERY      Family History  Problem Relation Age of Onset   Asthma Mother    Hypertension Mother     Social History   Tobacco Use   Smoking status: Every Day    Packs/day: .5    Types: Cigarettes   Smokeless tobacco: Never  Vaping Use   Vaping Use: Never used  Substance Use Topics   Alcohol use: Never   Drug use: Never    ROS   Objective:   Vitals: BP 104/65 (BP Location: Left Arm)   Pulse 84   Temp (!) 97.5 F (36.4 C) (Oral)   Resp 18   LMP 06/20/2022   SpO2 98%  Physical Exam Constitutional:      General: She is not in acute distress.    Appearance: Normal appearance. She is well-developed. She is not ill-appearing, toxic-appearing or diaphoretic.  HENT:     Head: Normocephalic and atraumatic.     Nose: Nose normal.     Mouth/Throat:     Mouth: Mucous membranes are moist.     Pharynx: Oropharynx is clear.  Eyes:     General: No scleral icterus.       Right eye: No discharge.        Left eye: No discharge.     Extraocular Movements: Extraocular movements intact.     Conjunctiva/sclera: Conjunctivae normal.  Cardiovascular:     Rate and Rhythm: Normal rate.  Pulmonary:     Effort: Pulmonary effort is normal.  Abdominal:     General: Bowel sounds are normal. There is no distension.     Palpations: Abdomen is  soft. There is no mass.     Tenderness: There is generalized abdominal tenderness and tenderness in the epigastric area and periumbilical area. There is no right CVA tenderness, left CVA tenderness, guarding or rebound.  Skin:    General: Skin is warm and dry.     Comments: Dyshidrotic eczema of the palmar proximal surface of either hand.  Neurological:     General: No focal deficit present.     Mental Status: She is alert and oriented to person, place, and time.  Psychiatric:        Mood and Affect: Mood normal.        Behavior: Behavior normal.        Thought Content: Thought content normal.        Judgment: Judgment normal.     Assessment and Plan :   PDMP not reviewed this encounter.  1. Abdominal pain, unspecified abdominal location   2. Constipation, unspecified constipation type   3. Dyshidrotic eczema     No signs of an acute abdomen.  Emphasized need for significant dietary modifications.  Start a stool softener.  Refilled her omeprazole and will add famotidine.  Also emphasized need to avoid keeping her hands moist or wet for better management of dyshidrotic eczema.  I refilled the steroid cream and discussed appropriate use of topical steroids. Counseled patient on potential for adverse effects with medications prescribed/recommended today, ER and return-to-clinic precautions discussed, patient verbalized understanding.    Jaynee Eagles, [place] 07/19/22 1141

## 2022-08-25 ENCOUNTER — Ambulatory Visit
Admission: EM | Admit: 2022-08-25 | Discharge: 2022-08-25 | Disposition: A | Discharge status: Home / Self Care | Payer: Medicaid Other

## 2022-08-25 DIAGNOSIS — T22212A Burn of second degree of left forearm, initial encounter: Secondary | ICD-10-CM | POA: Diagnosis not present

## 2022-08-25 MED ORDER — SILVER SULFADIAZINE 1 % EX CREA: 1.0000 | TOPICAL_CREAM | Freq: Two times a day (BID) | CUTANEOUS | 0 refills | Status: AC

## 2022-08-25 NOTE — Discharge Instructions (Addendum)
Please change your dressing 2-3 times daily. Apply silvadene cream generously and secure with a non-stick dressing. Each time you change your dressing, make sure you clean gently around the perimeter of the wound with gentle soap and warm water. Do not peel off any dead skin. If it comes off in the process of washing your wound or removing the dressing, that's okay. Pat your wound dry and let it air out if possible for an hour before reapplying another dressing.   
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
## 2022-09-11 ENCOUNTER — Ambulatory Visit
Admission: EM | Admit: 2022-09-11 | Discharge: 2022-09-11 | Disposition: A | Discharge status: Home / Self Care | Payer: Medicaid Other | Attending: Nurse Practitioner | Admitting: Nurse Practitioner

## 2022-09-11 DIAGNOSIS — G8929 Other chronic pain: Secondary | ICD-10-CM

## 2022-09-11 DIAGNOSIS — M545 Low back pain, unspecified: Secondary | ICD-10-CM

## 2022-09-11 MED ORDER — CYCLOBENZAPRINE HCL 10 MG PO TABS: 10.0000 mg | ORAL_TABLET | Freq: Two times a day (BID) | ORAL | 0 refills | Status: DC | PRN

## 2022-09-11 NOTE — ED Triage Notes (Signed)
Pt reports low back pain x 1 year. Back pain is worse after she visited the chiropractor couple months ago. Reports she was told she has scoliosis. Pt was seeing by her PCP 2 weeks ago for back pain and was refer for physical therapy.

## 2022-09-11 NOTE — ED Provider Notes (Signed)
[facility]    CSN: [id number] Arrival date & time: 09/11/22  1640      History   Chief Complaint Chief Complaint  Patient presents with   Back Pain    HPI Robin Mcbride is a 37 y.o. female presents for chronic back pain.  Patient states she has had chronic intermittent low back pain for at least a year.  She states she was told she has lower spine scoliosis which was new for her.  She saw her PCP last week who put her on Mobic and sent her to physical therapy.  She states that she does not like how the Mobic makes her feel.  She has not started PT yet.  She denies any current injury.  No numbness/tingling/weakness of his lower extremities, no bowel or bladder incontinence, no saddle paresthesia.  She has been taking Tylenol and heat OTC with some improvement.  No other concerns at this time.   Back Pain   Past Medical History:  Diagnosis Date   Anemia    Asthma    Asthma    Heart murmur     There are no problems to display for this patient.   Past Surgical History:  Procedure Laterality Date   BREAST SURGERY      OB History   No obstetric history on file.      Home Medications    Prior to Admission medications   Medication Sig Start Date End Date Taking? Authorizing Provider  cyclobenzaprine (FLEXERIL) 10 MG tablet Take 1 tablet (10 mg total) by mouth 2 (two) times daily as needed for muscle spasms. 09/11/22  Yes Radford Pax, NP  albuterol (ACCUNEB) 1.25 MG/3ML nebulizer solution Take by nebulization.    [provider]  buPROPion (WELLBUTRIN XL) 150 MG 24 hr tablet Take by mouth. 08/23/22   [provider]  diclofenac Sodium (VOLTAREN) 1 % GEL Apply 4 g topically 4 (four) times daily. Apply to affected areas 4 times daily as needed for pain. 03/14/22   Theadora Rama Scales, PA-C  docusate sodium (COLACE) 100 MG capsule Take 1 capsule (100 mg total) by mouth every 12 (twelve) hours. 07/19/22   Wallis Bamberg, PA-C  DULoxetine  (CYMBALTA) 20 MG capsule Take by mouth. 08/23/22   [provider]  famotidine (PEPCID) 20 MG tablet Take 1 tablet (20 mg total) by mouth 2 (two) times daily for 15 days. 07/19/22 08/03/22  Wallis Bamberg, PA-C  fluticasone (FLONASE) 50 MCG/ACT nasal spray Place 2 sprays into both nostrils daily. 12/03/21   Wallis Bamberg, PA-C  levocetirizine (XYZAL) 5 MG tablet Take 1 tablet (5 mg total) by mouth every evening. 12/03/21   Wallis Bamberg, PA-C  naproxen (NAPROSYN) 500 MG tablet Take 1 tablet (500 mg total) by mouth 2 (two) times daily. 03/14/22   Theadora Rama Scales, PA-C  omeprazole (PRILOSEC) 20 MG capsule Take 1 capsule (20 mg total) by mouth daily. 07/19/22   Wallis Bamberg, PA-C  sertraline (ZOLOFT) 25 MG tablet Take 1 tablet (25 mg total) by mouth daily. 01/12/22 07/11/22  Theadora Rama Scales, PA-C  silver sulfADIAZINE (SILVADENE) 1 % cream Apply 1 Application topically 2 (two) times daily. 08/25/22   Wallis Bamberg, PA-C  triamcinolone cream (KENALOG) 0.1 % Apply twice daily for 1 week. 07/19/22   Wallis Bamberg, PA-C  VENTOLIN HFA 108 (90 Base) MCG/ACT inhaler Inhale 2 puffs into the lungs every 6 (six) hours as needed.    [provider]    [facility]  History Family History  Problem Relation Age of Onset   Asthma Mother    Hypertension Mother     Social History Social History   Tobacco Use   Smoking status: Every Day    Packs/day: .5    Types: Cigarettes   Smokeless tobacco: Never  Vaping Use   Vaping Use: Never used  Substance Use Topics   Alcohol use: Not Currently   Drug use: Never     Allergies   Ibuprofen, Influenza vaccines, Shellfish allergy, and Wild lettuce extract (lactuca virosa)   Review of Systems Review of Systems  Musculoskeletal:  Positive for back pain.     Physical Exam Triage Vital Signs ED Triage Vitals  Enc Vitals Group     BP 09/11/22 1758 125/87     Pulse Rate 09/11/22 1758 83     Resp 09/11/22 1758 18     Temp 09/11/22 1758 98.1 F (36.7 C)      Temp Source 09/11/22 1758 Oral     SpO2 09/11/22 1758 97 %     Weight --      Height --      Head Circumference --      Peak Flow --      Pain Score 09/11/22 1805 9     Pain Loc --      Pain Edu? --      Excl. in GC? --    No data found.  Updated Vital Signs BP 125/87 (BP Location: Left Arm)   Pulse 83   Temp 98.1 F (36.7 C) (Oral)   Resp 18   LMP  (Within Months) Comment: 2 months  SpO2 97%   Visual Acuity Right Eye Distance:   Left Eye Distance:   Bilateral Distance:    Right Eye Near:   Left Eye Near:    Bilateral Near:     Physical Exam Vitals and nursing note reviewed.  Constitutional:      General: She is not in acute distress.    Appearance: Normal appearance. She is obese. She is not ill-appearing.  HENT:     Head: Normocephalic and atraumatic.  Eyes:     Pupils: Pupils are equal, round, and reactive to light.  Cardiovascular:     Rate and Rhythm: Normal rate.  Pulmonary:     Effort: Pulmonary effort is normal.  Musculoskeletal:     Cervical back: Normal.     Thoracic back: Normal.     Lumbar back: Spasms and tenderness present. No swelling, edema, deformity, signs of trauma, lacerations or bony tenderness. Normal range of motion. Negative right straight leg raise test and negative left straight leg raise test. No scoliosis.       Back:     Comments: Strength 5 out of 5 bilateral lower extremities  Skin:    General: Skin is warm and dry.  Neurological:     General: No focal deficit present.     Mental Status: She is alert and oriented to person, place, and time.  Psychiatric:        Mood and Affect: Mood normal.        Behavior: Behavior normal.      UC Treatments / Results  Labs (all labs ordered are listed, but only abnormal results are displayed) Labs Reviewed - No data to display  EKG   Radiology No results found.  Procedures Procedures (including critical care time)  Medications Ordered in UC Medications - No data to  display  Initial Impression /  Assessment and Plan / UC Course  I have reviewed the triage vital signs and the nursing notes.  Pertinent labs & imaging results that were available during my care of the patient were reviewed by me and considered in my medical decision making (see chart for details).     Reviewed exam and symptoms with patient.  No red flags.  Reviewed previous progress notes Will do trial of Flexeril.  Side effect profile reviewed Advised her to contact her PCP about her Mobic as she does not tolerate Continue heat and Tylenol as needed Rest Follow-up with PCP as scheduled appointment on May 30 Strict ER precautions reviewed and patient verbalized understanding Final Clinical Impressions(s) / UC Diagnoses   Final diagnoses:  Chronic midline low back pain, unspecified whether sciatica present     Discharge Instructions      You can start Flexeril twice daily as needed for your back pain.  Please note this medication can make you drowsy.  Do not drink alcohol or drive while on this medication Please speak to your PCP about your Mobic and possible alternatives Continue heat as needed Rest Follow-up with your PCP at your scheduled appointment on 5/30 Please go to the emergency room for any worsening symptoms    ED Prescriptions     Medication Sig Dispense Auth. Provider   cyclobenzaprine (FLEXERIL) 10 MG tablet Take 1 tablet (10 mg total) by mouth 2 (two) times daily as needed for muscle spasms. 20 tablet Radford Pax, NP      PDMP not reviewed this encounter.   Radford Pax, NP 09/11/22 [phone]

## 2022-09-11 NOTE — Discharge Instructions (Addendum)
You can start Flexeril twice daily as needed for your back pain.  Please note this medication can make you drowsy.  Do not drink alcohol or drive while on this medication Please speak to your PCP about your Mobic and possible alternatives Continue heat as needed Rest Follow-up with your PCP at your scheduled appointment on 5/30 Please go to the emergency room for any worsening symptoms

## 2022-11-08 ENCOUNTER — Telehealth: Payer: Self-pay | Admitting: Internal Medicine

## 2022-11-08 ENCOUNTER — Ambulatory Visit
Admission: EM | Admit: 2022-11-08 | Discharge: 2022-11-08 | Disposition: A | Discharge status: Home / Self Care | Payer: Medicaid Other | Attending: Internal Medicine | Admitting: Internal Medicine

## 2022-11-08 ENCOUNTER — Telehealth: Payer: Self-pay

## 2022-11-08 ENCOUNTER — Encounter: Payer: Self-pay | Admitting: Internal Medicine

## 2022-11-08 DIAGNOSIS — L299 Pruritus, unspecified: Secondary | ICD-10-CM

## 2022-11-08 DIAGNOSIS — L309 Dermatitis, unspecified: Secondary | ICD-10-CM | POA: Diagnosis not present

## 2022-11-08 MED ORDER — FLUOCINONIDE 0.05 % EX OINT: TOPICAL_OINTMENT | Freq: Two times a day (BID) | CUTANEOUS | Status: DC

## 2022-11-08 MED ORDER — BETAMETHASONE DIPROPIONATE 0.05 % EX OINT: TOPICAL_OINTMENT | Freq: Two times a day (BID) | CUTANEOUS | 0 refills | Status: DC

## 2022-11-08 MED ORDER — CLOTRIMAZOLE-BETAMETHASONE 1-0.05 % EX CREA: TOPICAL_CREAM | CUTANEOUS | 0 refills | Status: DC

## 2022-11-08 NOTE — Discharge Instructions (Addendum)
Use betamethasone cream twice a day for 10 days.  Only use betamethasone, stop using triamcinolone steroid cream. Avoid chemicals to hands. Schedule an appointment with your primary care provider for follow-up in the next 1-2 weeks.  If you develop any new or worsening symptoms or if your symptoms do not start to improve, pleases return here or follow-up with your primary care provider. If your symptoms are severe, please go to the emergency room.

## 2022-11-08 NOTE — Telephone Encounter (Signed)
Betamethasone steroid is not covered by patient's insurance, she calls requesting different topical steroid. Will send generic form of Lidex fluocinonide ointment BID for 7 days instead.

## 2022-11-08 NOTE — Telephone Encounter (Signed)
Left Voicemail that a different medication is being sent in to the pharmacy.

## 2022-11-08 NOTE — ED Provider Notes (Addendum)
[facility]    CSN: [id number] Arrival date & time: 11/08/22  1258      History   Chief Complaint Chief Complaint  Patient presents with   Pruritis    HPI Robin Mcbride is a 37 y.o. female.   Patient presents to urgent care for evaluation of chronic itching to both hands that has been intermittent over the last 1 year but has become more constant over the last couple of months.  History of eczema to both hands and has been using triamcinolone cream twice daily for the last 3 days as well as Aquaphor ointment for the last several months without relief of itching.  Denies redness, swelling, and drainage from dry lesions on hands. Reports interdigital itching as well as dry lesions to bilateral palms.  No recent antibiotic or oral steroid use.  No recent contacts with similar rash.  Denies recent exposure to new personal hygiene products, soaps, or cleaning products.  She has a dermatologist and primary care provider but has not been able to schedule appointments with these providers.  Does not currently take daily antihistamine.     Past Medical History:  Diagnosis Date   Anemia    Asthma    Asthma    Heart murmur     There are no problems to display for this patient.   Past Surgical History:  Procedure Laterality Date   BREAST SURGERY      OB History   No obstetric history on file.      Home Medications    Prior to Admission medications   Medication Sig Start Date End Date Taking? Authorizing Provider  betamethasone dipropionate (DIPROLENE) 0.05 % ointment Apply topically 2 (two) times daily for 7 days. 11/08/22 11/15/22 Yes Connell Bognar, Donavan Burnet, FNP  albuterol (ACCUNEB) 1.25 MG/3ML nebulizer solution Take by nebulization.    [provider]  buPROPion (WELLBUTRIN XL) 150 MG 24 hr tablet Take by mouth. 08/23/22   [provider]  cyclobenzaprine (FLEXERIL) 10 MG tablet Take 1 tablet (10 mg total) by mouth 2 (two) times daily as  needed for muscle spasms. 09/11/22   Radford Pax, NP  diclofenac Sodium (VOLTAREN) 1 % GEL Apply 4 g topically 4 (four) times daily. Apply to affected areas 4 times daily as needed for pain. 03/14/22   Theadora Rama Scales, PA-C  docusate sodium (COLACE) 100 MG capsule Take 1 capsule (100 mg total) by mouth every 12 (twelve) hours. 07/19/22   Wallis Bamberg, PA-C  DULoxetine (CYMBALTA) 20 MG capsule Take by mouth. 08/23/22   [provider]  famotidine (PEPCID) 20 MG tablet Take 1 tablet (20 mg total) by mouth 2 (two) times daily for 15 days. 07/19/22 08/03/22  Wallis Bamberg, PA-C  fluticasone (FLONASE) 50 MCG/ACT nasal spray Place 2 sprays into both nostrils daily. 12/03/21   Wallis Bamberg, PA-C  levocetirizine (XYZAL) 5 MG tablet Take 1 tablet (5 mg total) by mouth every evening. 12/03/21   Wallis Bamberg, PA-C  naproxen (NAPROSYN) 500 MG tablet Take 1 tablet (500 mg total) by mouth 2 (two) times daily. 03/14/22   Theadora Rama Scales, PA-C  omeprazole (PRILOSEC) 20 MG capsule Take 1 capsule (20 mg total) by mouth daily. 07/19/22   Wallis Bamberg, PA-C  sertraline (ZOLOFT) 25 MG tablet Take 1 tablet (25 mg total) by mouth daily. 01/12/22 07/11/22  Theadora Rama Scales, PA-C  silver sulfADIAZINE (SILVADENE) 1 % cream Apply 1 Application topically 2 (two) times daily. 08/25/22  Wallis Bamberg, PA-C  VENTOLIN HFA 108 [phone] Base) MCG/ACT inhaler Inhale 2 puffs into the lungs every 6 (six) hours as needed.    [provider]    Family History Family History  Problem Relation Age of Onset   Asthma Mother    Hypertension Mother     Social History Social History   Tobacco Use   Smoking status: Every Day    Packs/day: .5    Types: Cigarettes   Smokeless tobacco: Never  Vaping Use   Vaping Use: Never used  Substance Use Topics   Alcohol use: Not Currently   Drug use: Never     Allergies   Ibuprofen, Influenza vaccines, Shellfish allergy, and Wild lettuce extract (lactuca virosa)   Review  of Systems Review of Systems Per HPI  Physical Exam Triage Vital Signs ED Triage Vitals  Enc Vitals Group     BP 11/08/22 1337 124/87     Pulse Rate 11/08/22 1337 95     Resp 11/08/22 1337 18     Temp 11/08/22 1337 98.4 F (36.9 C)     Temp Source 11/08/22 1337 Oral     SpO2 11/08/22 1337 96 %     Weight --      Height --      Head Circumference --      Peak Flow --      Pain Score 11/08/22 1341 0     Pain Loc --      Pain Edu? --      Excl. in GC? --    No data found.  Updated Vital Signs BP 124/87 (BP Location: Right Arm)   Pulse 95   Temp 98.4 F (36.9 C) (Oral)   Resp 18   LMP 10/26/2022 (Approximate)   SpO2 96%   Visual Acuity Right Eye Distance:   Left Eye Distance:   Bilateral Distance:    Right Eye Near:   Left Eye Near:    Bilateral Near:     Physical Exam Vitals and nursing note reviewed.  Constitutional:      Appearance: She is not ill-appearing or toxic-appearing.  HENT:     Head: Normocephalic and atraumatic.     Right Ear: Hearing and external ear normal.     Left Ear: Hearing and external ear normal.     Nose: Nose normal.     Mouth/Throat:     Lips: Pink.  Eyes:     General: Lids are normal. Vision grossly intact. Gaze aligned appropriately.     Extraocular Movements: Extraocular movements intact.     Conjunctiva/sclera: Conjunctivae normal.  Cardiovascular:     Pulses:          Radial pulses are 2+ on the right side and 2+ on the left side.  Pulmonary:     Effort: Pulmonary effort is normal.  Musculoskeletal:     Cervical back: Neck supple.  Skin:    General: Skin is warm and dry.     Capillary Refill: Capillary refill takes less than 2 seconds.     Findings: Rash (Patchy areas of dry skin to the bilateral palms of the hands with 1-2 interdigital fissures.) present.     Comments: See image of hands below.   Neurological:     General: No focal deficit present.     Mental Status: She is alert and oriented to person, place, and  time. Mental status is at baseline.     Cranial Nerves: No dysarthria or facial asymmetry.  Psychiatric:        Mood and Affect: Mood normal.        Speech: Speech normal.        Behavior: Behavior normal.        Thought Content: Thought content normal.        Judgment: Judgment normal.    UC Treatments / Results  Labs (all labs ordered are listed, but only abnormal results are displayed) Labs Reviewed - No data to display  EKG   Radiology No results found.  Procedures Procedures (including critical care time)  Medications Ordered in UC Medications - No data to display  Initial Impression / Assessment and Plan / UC Course  I have reviewed the triage vital signs and the nursing notes.  Pertinent labs & imaging results that were available during my care of the patient were reviewed by me and considered in my medical decision making (see chart for details).   1. Eczema of both hands, itching of both hands Triamcinolone and Aquaphor have not provided any relief of itching, therefore will step up therapy with betamethasone BID for 7 days. Aqupahor only after 7 days. No triamcinolone while using betamethasone. May use over the counter zyrtec antihistamine daily at bedtime.  No signs of secondary bacterial infection. Discussed risks of long-term topical steroid use. Discussed avoiding environmental triggers. Patient plans on calling dermatologist and PCP today to schedule follow-up appointments.   Discussed red flag signs and symptoms of worsening condition,when to call the PCP office, return to urgent care, and when to seek higher level of care in the emergency department. Counseled patient regarding appropriate use of medications and potential side effects for all medications recommended or prescribed today. Patient verbalizes understanding and agreement with plan. Discharged in stable condition.   Final Clinical Impressions(s) / UC Diagnoses   Final diagnoses:  Eczema of both  hands  Itching of both hands     Discharge Instructions      Use betamethasone cream twice a day for 10 days.  Only use betamethasone, stop using triamcinolone steroid cream. Avoid chemicals to hands. Schedule an appointment with your primary care provider for follow-up in the next 1-2 weeks.  If you develop any new or worsening symptoms or if your symptoms do not start to improve, pleases return here or follow-up with your primary care provider. If your symptoms are severe, please go to the emergency room.    ED Prescriptions     Medication Sig Dispense Auth. Provider   clotrimazole-betamethasone (LOTRISONE) cream  (Status: Discontinued) Apply to affected area 2 times daily prn 15 g Reita May M, FNP   betamethasone dipropionate (DIPROLENE) 0.05 % ointment Apply topically 2 (two) times daily for 7 days. 15 g Carlisle Beers, FNP      PDMP not reviewed this encounter.   Carlisle Beers, FNP 11/08/22 1539    Carlisle Beers, FNP 11/08/22 1540

## 2022-11-08 NOTE — ED Triage Notes (Signed)
Pt reports itching hands over 1 year. Pt reports she was prescribed some cream before without relief.

## 2022-11-09 ENCOUNTER — Telehealth: Payer: Self-pay | Admitting: Family Medicine

## 2022-11-09 ENCOUNTER — Telehealth (HOSPITAL_COMMUNITY): Payer: Self-pay | Admitting: Internal Medicine

## 2022-11-09 DIAGNOSIS — L309 Dermatitis, unspecified: Secondary | ICD-10-CM

## 2022-11-09 MED ORDER — FLUOCINONIDE 0.05 % EX CREA: 1.0000 | TOPICAL_CREAM | Freq: Two times a day (BID) | CUTANEOUS | 0 refills | Status: DC

## 2022-11-09 MED ORDER — MOMETASONE FUROATE 0.1 % EX CREA: 1.0000 | TOPICAL_CREAM | Freq: Every day | CUTANEOUS | 0 refills | Status: AC

## 2022-11-09 NOTE — Telephone Encounter (Signed)
Patient called stating that the next steroid cream that was sent is also not on the PDL for Medicaid.  By my review fluocinonide ointment and solution but not cream are on it.  I went ahead and sent a totally different cream mometasone which also seems to be on the preferred list.

## 2022-11-09 NOTE — Telephone Encounter (Signed)
Lidex cream prescribed as during visit medication. I have now sent Lidex to patient's requested pharmacy.

## 2022-12-07 ENCOUNTER — Encounter (HOSPITAL_BASED_OUTPATIENT_CLINIC_OR_DEPARTMENT_OTHER): Payer: Self-pay

## 2022-12-07 ENCOUNTER — Emergency Department (HOSPITAL_BASED_OUTPATIENT_CLINIC_OR_DEPARTMENT_OTHER)
Admission: EM | Admit: 2022-12-07 | Discharge: 2022-12-07 | Disposition: A | Discharge status: Home / Self Care | Payer: Medicaid Other | Attending: Emergency Medicine | Admitting: Emergency Medicine

## 2022-12-07 DIAGNOSIS — J45909 Unspecified asthma, uncomplicated: Secondary | ICD-10-CM | POA: Insufficient documentation

## 2022-12-07 DIAGNOSIS — M545 Low back pain, unspecified: Secondary | ICD-10-CM | POA: Diagnosis present

## 2022-12-07 MED ORDER — LIDOCAINE 5 % EX PTCH
1.0000 | MEDICATED_PATCH | Freq: Once | CUTANEOUS | Status: DC
  Administered 2022-12-07: 1 via TRANSDERMAL
  Filled 2022-12-07: qty 1

## 2022-12-07 MED ORDER — DIAZEPAM 5 MG/ML IJ SOLN: 2.5000 mg | Freq: Once | INTRAMUSCULAR | Status: DC

## 2022-12-07 MED ORDER — KETOROLAC TROMETHAMINE 15 MG/ML IJ SOLN
15.0000 mg | Freq: Once | INTRAMUSCULAR | Status: AC
  Administered 2022-12-07: 15 mg via INTRAMUSCULAR
  Filled 2022-12-07: qty 1

## 2022-12-07 MED ORDER — DEXAMETHASONE SODIUM PHOSPHATE 10 MG/ML IJ SOLN
10.0000 mg | Freq: Once | INTRAMUSCULAR | Status: AC
  Administered 2022-12-07: 10 mg via INTRAMUSCULAR
  Filled 2022-12-07: qty 1

## 2022-12-07 MED ORDER — DIAZEPAM 2 MG PO TABS
2.0000 mg | ORAL_TABLET | Freq: Once | ORAL | Status: AC
  Administered 2022-12-07: 2 mg via ORAL
  Filled 2022-12-07: qty 1

## 2022-12-07 NOTE — ED Provider Notes (Signed)
[facility] [place] Provider Note   CSN: [id number] Arrival date & time: 12/07/22  1417     History  Chief Complaint  Patient presents with   Back Pain    Robin Mcbride is a 37 y.o. female.  With past medical history of asthma, anemia who presents to the emergency department with back pain.  States she has had back pain for about one year. States one year ago she slipped and hurt her back. She went to urgent care after this and they performed plain film imaging which was negative. She had recurrence of pain at some point after this and she was given Robaxin which she states has not helped. Pain continues without re-injury. She occasionally has numbness in her legs. She denies having saddle anesthesia, retention or incontinence of bladder or bowel, weakness in her lower extremities. No IVDU or fevers.     Back Pain      Home Medications Prior to Admission medications   Medication Sig Start Date End Date Taking? Authorizing Provider  albuterol (ACCUNEB) 1.25 MG/3ML nebulizer solution Take by nebulization.    [provider]  buPROPion (WELLBUTRIN XL) 150 MG 24 hr tablet Take by mouth. 08/23/22   [provider]  cyclobenzaprine (FLEXERIL) 10 MG tablet Take 1 tablet (10 mg total) by mouth 2 (two) times daily as needed for muscle spasms. 09/11/22   Radford Pax, NP  diclofenac Sodium (VOLTAREN) 1 % GEL Apply 4 g topically 4 (four) times daily. Apply to affected areas 4 times daily as needed for pain. 03/14/22   Theadora Rama Scales, PA-C  docusate sodium (COLACE) 100 MG capsule Take 1 capsule (100 mg total) by mouth every 12 (twelve) hours. 07/19/22   Wallis Bamberg, PA-C  DULoxetine (CYMBALTA) 20 MG capsule Take by mouth. 08/23/22   [provider]  famotidine (PEPCID) 20 MG tablet Take 1 tablet (20 mg total) by mouth 2 (two) times daily for 15 days. 07/19/22 08/03/22  Wallis Bamberg, PA-C  fluticasone (FLONASE) 50 MCG/ACT  nasal spray Place 2 sprays into both nostrils daily. 12/03/21   Wallis Bamberg, PA-C  levocetirizine (XYZAL) 5 MG tablet Take 1 tablet (5 mg total) by mouth every evening. 12/03/21   Wallis Bamberg, PA-C  mometasone (ELOCON) 0.1 % cream Apply 1 Application topically daily. To affected area till better. Please cancel other steroid creams. Mometasone cream is on MCD pdl. 11/09/22   Zenia Resides, MD  naproxen (NAPROSYN) 500 MG tablet Take 1 tablet (500 mg total) by mouth 2 (two) times daily. 03/14/22   Theadora Rama Scales, PA-C  omeprazole (PRILOSEC) 20 MG capsule Take 1 capsule (20 mg total) by mouth daily. 07/19/22   Wallis Bamberg, PA-C  sertraline (ZOLOFT) 25 MG tablet Take 1 tablet (25 mg total) by mouth daily. 01/12/22 07/11/22  Theadora Rama Scales, PA-C  silver sulfADIAZINE (SILVADENE) 1 % cream Apply 1 Application topically 2 (two) times daily. 08/25/22   Wallis Bamberg, PA-C  VENTOLIN HFA 108 (90 Base) MCG/ACT inhaler Inhale 2 puffs into the lungs every 6 (six) hours as needed.    [provider]      Allergies    Ibuprofen, Influenza vaccines, Shellfish allergy, and Wild lettuce extract (lactuca virosa)    Review of Systems   Review of Systems  Musculoskeletal:  Positive for back pain.  All other systems reviewed and are negative.   Physical Exam Updated Vital Signs BP (!) 128/104 (BP Location: Left Arm)  Pulse 81   Temp 97.9 F (36.6 C) (Oral)   Resp 20   LMP 11/30/2022 (Approximate)   SpO2 96%  Physical Exam Vitals and nursing note reviewed.  Constitutional:      General: She is not in acute distress.    Appearance: Normal appearance. She is not ill-appearing or toxic-appearing.  HENT:     Head: Normocephalic and atraumatic.  Eyes:     General: No scleral icterus. Pulmonary:     Effort: Pulmonary effort is normal. No respiratory distress.  Musculoskeletal:     Lumbar back: Bony tenderness present.     Comments: Strength 5/5 in BLE, neurovascularly intact   Skin:     General: Skin is warm and dry.     Findings: No rash.  Neurological:     General: No focal deficit present.     Mental Status: She is alert and oriented to person, place, and time.  Psychiatric:        Mood and Affect: Mood normal.        Behavior: Behavior normal.        Thought Content: Thought content normal.        Judgment: Judgment normal.     ED Results / Procedures / Treatments   Labs (all labs ordered are listed, but only abnormal results are displayed) Labs Reviewed - No data to display  EKG None  Radiology No results found.  Procedures Procedures   Medications Ordered in ED Medications  lidocaine (LIDODERM) 5 % 1 patch (1 patch Transdermal Patch Applied 12/07/22 1517)  dexamethasone (DECADRON) injection 10 mg (10 mg Intramuscular Given 12/07/22 1519)  ketorolac (TORADOL) 15 MG/ML injection 15 mg (15 mg Intramuscular Given 12/07/22 1518)  diazepam (VALIUM) tablet 2 mg (2 mg Oral Given 12/07/22 1516)    ED Course/ Medical Decision Making/ A&P    Medical Decision Making Risk Prescription drug management.  Initial Impression and Ddx 37 year old female who presents to the emergency department with back pain. Patient PMH that increases complexity of ED encounter: Asthma, anemia Differential: Lumbar strain, DDD, epidural abscess, cauda equina, mass, fracture or subluxation, discitis, etc.  Interpretation of Diagnostics I independent reviewed and interpreted the labs as followed: not indicated   - I independently visualized the following imaging with scope of interpretation limited to determining acute life threatening conditions related to emergency care: not indicated  Patient Reassessment and Ultimate Disposition/Management 38 year old female who presents to the emergency department with back pain. On exam, non toxic appearing. No focal neurological findings. Strength is equal bilaterally.   No cauda equina symptoms. No weakness. She does have some midline  tenderness. Negative previous plain films. Will give her pain control, steroids and reassess. Unfortunately, no MRI at this facility. Do not feel she acutely needs CT. No IVDU or fevers concerning for epidural abscess. No red flag symptoms for mass or other.   On reassessment she has improvement in pain symptoms.  Will refer to orthopedics so they can perform MRI, definitive treatment if needed.  Do not feel that she needs imaging here in the emergency department.  She is ambulatory without assistance.  She verbalized understanding of discharge instructions and follow-up plans.  Given return precautions for significantly worsening symptoms.  Patient management required discussion with the following services or consulting groups:  None  Complexity of Problems Addressed Chronic illness with exacerbation  Additional Data Reviewed and Analyzed Further history obtained from: Further history from spouse/family member, Past medical history and medications listed in the EMR,  and Care Everywhere  Patient Encounter Risk Assessment SDOH impact on management  Final Clinical Impression(s) / ED Diagnoses Final diagnoses:  Chronic midline low back pain without sciatica    Rx / DC Orders ED Discharge Orders          Ordered    Ambulatory referral to Orthopedics        12/07/22 1555              Cristopher Peru, PA-C 12/07/22 1558    Long, Arlyss Repress, MD 12/07/22 [phone]

## 2022-12-07 NOTE — ED Triage Notes (Signed)
Pt presents with complaint of chronic back pain.Back problems since 2016 then worsened 2023 Possibly has scoliosis.  Pt has been to chiropractor and was told her hips were uneven. Pt uses muscle relaxant with minimal relief

## 2022-12-07 NOTE — Discharge Instructions (Signed)
You were seen in the emergency department today for back pain.  I have sent a referral to orthopedics for you to have follow-up and possible MRI.  Please return to the emergency department if you have weakness in your legs, inability to control your bladder or bowel movements or numbness or tingling to your groin area.  You may also use ibuprofen and Tylenol for pain control.

## 2022-12-11 ENCOUNTER — Encounter: Payer: Self-pay | Admitting: Physical Medicine and Rehabilitation

## 2022-12-11 ENCOUNTER — Ambulatory Visit (INDEPENDENT_AMBULATORY_CARE_PROVIDER_SITE_OTHER): Payer: Medicaid Other | Admitting: Physical Medicine and Rehabilitation

## 2022-12-11 DIAGNOSIS — M545 Low back pain, unspecified: Secondary | ICD-10-CM | POA: Diagnosis not present

## 2022-12-11 DIAGNOSIS — G8929 Other chronic pain: Secondary | ICD-10-CM

## 2022-12-11 DIAGNOSIS — M25551 Pain in right hip: Secondary | ICD-10-CM | POA: Diagnosis not present

## 2022-12-11 DIAGNOSIS — M25552 Pain in left hip: Secondary | ICD-10-CM

## 2022-12-11 DIAGNOSIS — M7918 Myalgia, other site: Secondary | ICD-10-CM

## 2022-12-11 MED ORDER — MELOXICAM 15 MG PO TABS: 15.0000 mg | ORAL_TABLET | Freq: Every day | ORAL | 0 refills | Status: DC

## 2022-12-11 MED ORDER — METHOCARBAMOL 500 MG PO TABS: 500.0000 mg | ORAL_TABLET | Freq: Three times a day (TID) | ORAL | 0 refills | Status: DC

## 2022-12-11 NOTE — Progress Notes (Signed)
Functional Pain Scale - descriptive words and definitions  Distressing (6)    Pain is present/unable to complete most ADLs limited by pain/sleep is difficult and active distraction is only marginal. Moderate range order  Average Pain 8  Lower back pain that goes from the hips to the middle of the back. Numbness in the legs

## 2022-12-11 NOTE — Progress Notes (Signed)
Robin Mcbride - 37 y.o. female MRN [id number]  Date of birth: Jan 03, 1986  Office Visit Note: Visit Date: 12/11/2022 PCP: Morrell Riddle, PA-C Referred by: Weber, Dema Severin, PA-C  Subjective: Chief Complaint  Patient presents with   Lower Back - Pain   HPI: Robin Mcbride is a 37 y.o. female who comes in today for evaluation of chronic, worsening and severe bilateral lower back pain. Intermittent pain to hips and up her back. Pain ongoing for several years. Initially she had issues with back pain while bring pregnant in 2016, her pain became worse about 2 years ago after slipping on liquid while shopping at [facility]. Pain worsens with prolonged sitting and standing, describes sharp and stabbing pain, currently rates as 8 out of 10. States intermittent feeling of numbness/tingling to legs, also reports she feels her legs are going to give out on her. Some relief of pain with home exercise regimen, rest and use of medications. History of both physical therapy/chiropractic treatments with minimal relief of pain. Lumbar x-rays from January exhibit normal anatomical alignment, well preserved disc spacing, no spondylolisthesis. She was recently evaluated in the emergency department on 12/07/2022, was told she needed to come see orthopedics for lumbar MRI imaging. She has tried Meloxicam and Flexeril without significant relief of pain. Patient denies recent trauma or falls.    Oswestry Disability Index Score 32% 10 to 20 (40%) moderate disability: The patient experiences more pain and difficulty with sitting, lifting and standing. Travel and social life are more difficult, and they may be disabled from work. Personal care, sexual activity and sleeping are not grossly affected, and the patient can usually be managed by conservative means.  Review of Systems  Musculoskeletal:  Positive for back pain and myalgias.  Neurological:  Negative for tingling, sensory change, focal weakness and weakness.   All other systems reviewed and are negative.  Otherwise per HPI.  Assessment & Plan: Visit Diagnoses:    ICD-10-CM   1. Chronic bilateral low back pain without sciatica  M54.50 Ambulatory referral to Physical Therapy   G89.29     2. Bilateral hip pain  M25.551 Ambulatory referral to Physical Therapy   M25.552     3. Myofascial pain syndrome  M79.18 Ambulatory referral to Physical Therapy       Plan: Findings:  Chronic, worsening and severe bilateral lower back pain. Intermittent pain to hips and up her back. No radicular symptoms radiating down the legs. Patient continues to have severe pain despite good conservative therapies such as home exercise regimen, rest and use of medications. Patients clinical presentation and exam are consistent with myofascial pain syndrome. Tenderness noted to bilateral lumbar paraspinal region on exam today. Her exam is non focal, good strength to bilateral lower extremities. Given nature of her symptoms, non focal exam and continued myofascial pain I do not feel lumbar MRI imaging is warranted at this time. Next step is to have patient re-group with physical therapy. I do feel she needs to attend PT consistently for several weeks, she would benefit from manual treatments and dry needling. If her pain persists we discussed obtaining lumbar MRI imaging. We will see patient back as needed. No red flag symptoms noted upon exam today.     Meds & Orders:  Meds ordered this encounter  Medications   methocarbamol (ROBAXIN) 500 MG tablet    Sig: Take 1 tablet (500 mg total) by mouth 3 (three) times daily.    Dispense:  90 tablet  Refill:  0   meloxicam (MOBIC) 15 MG tablet    Sig: Take 1 tablet (15 mg total) by mouth daily.    Dispense:  30 tablet    Refill:  0    Orders Placed This Encounter  Procedures   Ambulatory referral to Physical Therapy    Follow-up: Return if symptoms worsen or fail to improve.   Procedures: No procedures performed       Clinical History: No specialty comments available.   She reports that she has been smoking cigarettes. She has never used smokeless tobacco. No results for input(s): "HGBA1C", "LABURIC" in the last 8760 hours.  Objective:  VS:  HT:    WT:   BMI:     BP:   HR: bpm  TEMP: ( )  RESP:  Physical Exam Vitals and nursing note reviewed.  HENT:     Head: Normocephalic and atraumatic.     Right Ear: External ear normal.     Left Ear: External ear normal.     Nose: Nose normal.     Mouth/Throat:     Mouth: Mucous membranes are moist.  Eyes:     Pupils: Pupils are equal, round, and reactive to light.  Cardiovascular:     Rate and Rhythm: Normal rate.     Pulses: Normal pulses.  Pulmonary:     Effort: Pulmonary effort is normal.  Abdominal:     General: Abdomen is flat. There is no distension.  Musculoskeletal:        General: Tenderness present.     Cervical back: Normal range of motion.     Comments: Patient rises from seated position to standing without difficulty. Good lumbar range of motion. No pain noted with facet loading. 5/5 strength noted with bilateral hip flexion, knee flexion/extension, ankle dorsiflexion/plantarflexion and EHL. No clonus noted bilaterally. No pain upon palpation of greater trochanters. No pain with internal/external rotation of bilateral hips. Sensation intact bilaterally. Tenderness noted upon palpation of bilateral lumbar paraspinal regions. Negative slump test bilaterally. Ambulates without aid, gait steady.     Skin:    General: Skin is warm and dry.     Capillary Refill: Capillary refill takes less than 2 seconds.  Neurological:     General: No focal deficit present.     Mental Status: She is alert and oriented to person, place, and time.  Psychiatric:        Mood and Affect: Mood normal.        Behavior: Behavior normal.     Ortho Exam  Imaging: No results found.  Past Medical/Family/Surgical/Social History: Medications & Allergies  reviewed per EMR, new medications updated. There are no problems to display for this patient.  Past Medical History:  Diagnosis Date   Anemia    Asthma    Asthma    Heart murmur    Family History  Problem Relation Age of Onset   Asthma Mother    Hypertension Mother    Past Surgical History:  Procedure Laterality Date   BREAST SURGERY     Social History   Occupational History   Not on file  Tobacco Use   Smoking status: Every Day    Current packs/day: 0.50    Types: Cigarettes   Smokeless tobacco: Never  Vaping Use   Vaping status: Never Used  Substance and Sexual Activity   Alcohol use: Not Currently   Drug use: Never   Sexual activity: Yes    Birth control/protection: None

## 2023-03-12 ENCOUNTER — Other Ambulatory Visit (HOSPITAL_COMMUNITY): Payer: Self-pay

## 2023-03-12 MED ORDER — WEGOVY 1.7 MG/0.75ML ~~LOC~~ SOAJ
SUBCUTANEOUS | 0 refills | Status: DC
  Filled 2023-03-12: qty 3, 28d supply, fill #0

## 2023-03-13 ENCOUNTER — Other Ambulatory Visit (HOSPITAL_COMMUNITY): Payer: Self-pay

## 2023-03-30 ENCOUNTER — Encounter: Payer: Self-pay | Admitting: Emergency Medicine

## 2023-03-30 ENCOUNTER — Other Ambulatory Visit: Payer: Self-pay

## 2023-03-30 ENCOUNTER — Ambulatory Visit
Admission: EM | Admit: 2023-03-30 | Discharge: 2023-03-30 | Disposition: A | Discharge status: Home / Self Care | Payer: Medicaid Other | Attending: Family Medicine | Admitting: Family Medicine

## 2023-03-30 DIAGNOSIS — M25511 Pain in right shoulder: Secondary | ICD-10-CM

## 2023-03-30 MED ORDER — TIZANIDINE HCL 4 MG PO TABS: 4.0000 mg | ORAL_TABLET | Freq: Three times a day (TID) | ORAL | 0 refills | Status: DC | PRN

## 2023-03-30 MED ORDER — DICLOFENAC SODIUM 75 MG PO TBEC: 75.0000 mg | DELAYED_RELEASE_TABLET | Freq: Two times a day (BID) | ORAL | 0 refills | Status: AC | PRN

## 2023-03-30 NOTE — ED Triage Notes (Signed)
Patient c/o right sided shoulder, neck, chest and back pain x 3-4 days.  Patient states that it started bothering her after raking leaves in the yard.  Patient does have a Meloxicam at home and took one w/o any relief.

## 2023-03-30 NOTE — Discharge Instructions (Addendum)
Diclofenac 75 mg--1 tablet 2 times daily as needed for pain   Take tizanidine 4 mg--1 every 8 hours as needed for muscle spasms; this medication can cause dizziness and sleepiness  Please follow-up with primary care

## 2023-03-30 NOTE — ED Provider Notes (Signed)
[facility]    CSN: [id number] Arrival date & time: 03/30/23  1219      History   Chief Complaint Chief Complaint  Patient presents with   Shoulder Pain    HPI Robin Mcbride is a 37 y.o. female.    Shoulder Pain Here for right shoulder pain that began on November 26.  Prior to that she had read a bunch of leaves.  No trauma or fall.  Her anterior and posterior right shoulder hurt.  Hurts more when she raises her right arm or when she coughs or breathes deeply.  Not much cough and no congestion and no fever.  Last menstrual cycle was November first.  Ibuprofen causes hives but she has tolerated naproxen and meloxicam in the past.  Meloxicam did not help her pain.  Past Medical History:  Diagnosis Date   Anemia    Asthma    Asthma    Heart murmur     There are no problems to display for this patient.   Past Surgical History:  Procedure Laterality Date   BREAST SURGERY      OB History   No obstetric history on file.      Home Medications    Prior to Admission medications   Medication Sig Start Date End Date Taking? Authorizing Provider  albuterol (ACCUNEB) 1.25 MG/3ML nebulizer solution Take by nebulization.   Yes [provider]  buPROPion (WELLBUTRIN XL) 150 MG 24 hr tablet Take by mouth. 08/23/22  Yes [provider]  diclofenac (VOLTAREN) 75 MG EC tablet Take 1 tablet (75 mg total) by mouth 2 (two) times daily as needed (pain). 03/30/23  Yes Zenia Resides, MD  docusate sodium (COLACE) 100 MG capsule Take 1 capsule (100 mg total) by mouth every 12 (twelve) hours. 07/19/22  Yes Wallis Bamberg, PA-C  DULoxetine (CYMBALTA) 20 MG capsule Take by mouth. 08/23/22  Yes [provider]  fluticasone (FLONASE) 50 MCG/ACT nasal spray Place 2 sprays into both nostrils daily. 12/03/21  Yes Wallis Bamberg, PA-C  levocetirizine (XYZAL) 5 MG tablet Take 1 tablet (5 mg total) by mouth every evening. 12/03/21  Yes Wallis Bamberg, PA-C   mometasone (ELOCON) 0.1 % cream Apply 1 Application topically daily. To affected area till better. Please cancel other steroid creams. Mometasone cream is on MCD pdl. 11/09/22  Yes Chelsye Suhre, Janace Aris, MD  omeprazole (PRILOSEC) 20 MG capsule Take 1 capsule (20 mg total) by mouth daily. 07/19/22  Yes Wallis Bamberg, PA-C  silver sulfADIAZINE (SILVADENE) 1 % cream Apply 1 Application topically 2 (two) times daily. 08/25/22  Yes Wallis Bamberg, PA-C  tiZANidine (ZANAFLEX) 4 MG tablet Take 1 tablet (4 mg total) by mouth every 8 (eight) hours as needed for muscle spasms. 03/30/23  Yes Nova Evett, Janace Aris, MD  VENTOLIN HFA 108 (90 Base) MCG/ACT inhaler Inhale 2 puffs into the lungs every 6 (six) hours as needed.   Yes [provider]  WEGOVY 1.7 MG/0.75ML SOAJ Inject 0.75 mLs (1.7 mg dose) into the skin once a week. 03/12/23  Yes   famotidine (PEPCID) 20 MG tablet Take 1 tablet (20 mg total) by mouth 2 (two) times daily for 15 days. 07/19/22 08/03/22  Wallis Bamberg, PA-C  sertraline (ZOLOFT) 25 MG tablet Take 1 tablet (25 mg total) by mouth daily. 01/12/22 07/11/22  Theadora Rama Scales, PA-C    Family History Family History  Problem Relation Age of Onset   Asthma Mother    Hypertension Mother  Social History Social History   Tobacco Use   Smoking status: Every Day    Current packs/day: 0.50    Types: Cigarettes   Smokeless tobacco: Never  Vaping Use   Vaping status: Never Used  Substance Use Topics   Alcohol use: Not Currently   Drug use: Never     Allergies   Ibuprofen, Influenza vaccines, Shellfish allergy, and Wild lettuce extract (lactuca virosa)   Review of Systems Review of Systems   Physical Exam Triage Vital Signs ED Triage Vitals  Encounter Vitals Group     BP 03/30/23 1321 112/77     Systolic BP Percentile --      Diastolic BP Percentile --      Pulse Rate 03/30/23 1321 86     Resp 03/30/23 1321 18     Temp 03/30/23 1321 98.5 F (36.9 C)     Temp Source 03/30/23  1321 Oral     SpO2 03/30/23 1321 97 %     Weight 03/30/23 1323 215 lb (97.5 kg)     Height 03/30/23 1323 5' (1.524 m)     Head Circumference --      Peak Flow --      Pain Score 03/30/23 1323 10     Pain Loc --      Pain Education --      Exclude from Growth Chart --    No data found.  Updated Vital Signs BP 112/77 (BP Location: Left Arm)   Pulse 86   Temp 98.5 F (36.9 C) (Oral)   Resp 18   Ht 5' (1.524 m)   Wt 97.5 kg   LMP 03/02/2023   SpO2 97%   BMI 41.99 kg/m   Visual Acuity Right Eye Distance:   Left Eye Distance:   Bilateral Distance:    Right Eye Near:   Left Eye Near:    Bilateral Near:     Physical Exam Vitals reviewed.  Constitutional:      General: She is not in acute distress.    Appearance: She is not ill-appearing, toxic-appearing or diaphoretic.  Eyes:     Extraocular Movements: Extraocular movements intact.     Pupils: Pupils are equal, round, and reactive to light.  Cardiovascular:     Rate and Rhythm: Normal rate and regular rhythm.  Musculoskeletal:     Comments: There is 2 days of her right upper chest near the clavicle.  No obvious deformity on exam.  She is also tender posteriorly over the trapezius.  Distally, neurovascular is intact.  Skin:    Coloration: Skin is not jaundiced or pale.  Neurological:     General: No focal deficit present.     Mental Status: She is alert and oriented to person, place, and time.  Psychiatric:        Behavior: Behavior normal.      UC Treatments / Results  Labs (all labs ordered are listed, but only abnormal results are displayed) Labs Reviewed - No data to display  EKG   Radiology No results found.  Procedures Procedures (including critical care time)  Medications Ordered in UC Medications - No data to display  Initial Impression / Assessment and Plan / UC Course  I have reviewed the triage vital signs and the nursing notes.  Pertinent labs & imaging results that were available  during my care of the patient were reviewed by me and considered in my medical decision making (see chart for details).     I  do feel like most of this is muscular in origin, most likely due to over use when she was raking leaves.  She does not want to do any injections so Toradol injection is not given today.  Diclofenac is sent in for pain and tizanidine is sent in as a muscle relaxer.  She is given some range of motion exercises for her shoulder and of asked to follow-up with primary care. Final Clinical Impressions(s) / UC Diagnoses   Final diagnoses:  Acute pain of right shoulder     Discharge Instructions      Diclofenac 75 mg--1 tablet 2 times daily as needed for pain   Take tizanidine 4 mg--1 every 8 hours as needed for muscle spasms; this medication can cause dizziness and sleepiness  Please follow-up with primary care       ED Prescriptions     Medication Sig Dispense Auth. Provider   diclofenac (VOLTAREN) 75 MG EC tablet Take 1 tablet (75 mg total) by mouth 2 (two) times daily as needed (pain). 20 tablet Lisett Dirusso, Janace Aris, MD   tiZANidine (ZANAFLEX) 4 MG tablet Take 1 tablet (4 mg total) by mouth every 8 (eight) hours as needed for muscle spasms. 15 tablet Alezander Dimaano, Janace Aris, MD      I have reviewed the PDMP during this encounter.   Zenia Resides, MD 03/30/23 1344

## 2023-04-10 ENCOUNTER — Other Ambulatory Visit (HOSPITAL_COMMUNITY): Payer: Self-pay

## 2023-04-10 MED ORDER — WEGOVY 2.4 MG/0.75ML ~~LOC~~ SOAJ
0.7500 mL | SUBCUTANEOUS | 0 refills | Status: DC
  Filled 2023-04-10: qty 3, 28d supply, fill #0

## 2023-04-27 ENCOUNTER — Ambulatory Visit
Admission: EM | Admit: 2023-04-27 | Discharge: 2023-04-27 | Disposition: A | Discharge status: Home / Self Care | Payer: Medicaid Other | Attending: Internal Medicine | Admitting: Internal Medicine

## 2023-04-27 ENCOUNTER — Encounter: Payer: Self-pay | Admitting: Emergency Medicine

## 2023-04-27 DIAGNOSIS — R051 Acute cough: Secondary | ICD-10-CM | POA: Diagnosis present

## 2023-04-27 DIAGNOSIS — J029 Acute pharyngitis, unspecified: Secondary | ICD-10-CM | POA: Insufficient documentation

## 2023-04-27 DIAGNOSIS — B349 Viral infection, unspecified: Secondary | ICD-10-CM | POA: Diagnosis present

## 2023-04-27 DIAGNOSIS — J4521 Mild intermittent asthma with (acute) exacerbation: Secondary | ICD-10-CM | POA: Insufficient documentation

## 2023-04-27 LAB — POC COVID19/FLU A&B COMBO
Covid Antigen, POC: NEGATIVE
Influenza A Antigen, POC: NEGATIVE
Influenza B Antigen, POC: NEGATIVE

## 2023-04-27 LAB — POCT RAPID STREP A (OFFICE): Rapid Strep A Screen: NEGATIVE

## 2023-04-27 MED ORDER — FLUTICASONE PROPIONATE 50 MCG/ACT NA SUSP: 1.0000 | Freq: Every day | NASAL | 0 refills | Status: AC

## 2023-04-27 MED ORDER — PROMETHAZINE-DM 6.25-15 MG/5ML PO SYRP: 5.0000 mL | ORAL_SOLUTION | Freq: Four times a day (QID) | ORAL | 0 refills | Status: DC | PRN

## 2023-04-27 MED ORDER — ALBUTEROL SULFATE HFA 108 (90 BASE) MCG/ACT IN AERS: 1.0000 | INHALATION_SPRAY | Freq: Four times a day (QID) | RESPIRATORY_TRACT | 0 refills | Status: DC | PRN

## 2023-04-27 NOTE — Discharge Instructions (Signed)
Start Promethazine DM as needed for cough.  Please note this medication can make you drowsy.  Do not drink alcohol or drive on this medication.  Flonase daily to help with your ear pain.  Albuterol inhaler to help with your wheezing or shortness of breath.  Lots of rest and fluids.  Please follow-up with your PCP in 2 to 3 days for recheck.  Please go to the ER if you develop any worsening symptoms.  I hope you feel better soon!

## 2023-04-27 NOTE — ED Provider Notes (Signed)
[facility]    CSN: [id number] Arrival date & time: 04/27/23  4098      History   Chief Complaint Chief Complaint  Patient presents with   Sore Throat    HPI Robin Mcbride is a 37 y.o. female  presents for evaluation of URI symptoms for 2 days. Patient reports associated symptoms of sore throat, cough, congestion, ear pain, wheezing. Denies N/V/D, fevers, body aches, shortness of breath. Patient does have a hx of asthma.  Reports has an expired inhaler which temporarily helps her wheezing.  Patient is an active smoker.  Reports sister has similar symptoms.  Pt has taken DayQuil OTC for symptoms. Pt has no other concerns at this time.    Sore Throat    Past Medical History:  Diagnosis Date   Anemia    Asthma    Asthma    Heart murmur     There are no active problems to display for this patient.   Past Surgical History:  Procedure Laterality Date   BREAST SURGERY      OB History   No obstetric history on file.      Home Medications    Prior to Admission medications   Medication Sig Start Date End Date Taking? Authorizing Provider  albuterol (VENTOLIN HFA) 108 (90 Base) MCG/ACT inhaler Inhale 1-2 puffs into the lungs every 6 (six) hours as needed for wheezing or shortness of breath. 04/27/23  Yes Radford Pax, NP  buPROPion (WELLBUTRIN XL) 150 MG 24 hr tablet Take by mouth. 08/23/22  Yes [provider]  diclofenac (VOLTAREN) 75 MG EC tablet Take 1 tablet (75 mg total) by mouth 2 (two) times daily as needed (pain). 03/30/23  Yes Zenia Resides, MD  docusate sodium (COLACE) 100 MG capsule Take 1 capsule (100 mg total) by mouth every 12 (twelve) hours. 07/19/22  Yes Wallis Bamberg, PA-C  famotidine (PEPCID) 20 MG tablet Take 1 tablet (20 mg total) by mouth 2 (two) times daily for 15 days. 07/19/22 04/27/23 Yes Wallis Bamberg, PA-C  fluticasone (FLONASE) 50 MCG/ACT nasal spray Place 1 spray into both nostrils daily. 04/27/23  Yes Radford Pax, NP   mometasone (ELOCON) 0.1 % cream Apply 1 Application topically daily. To affected area till better. Please cancel other steroid creams. Mometasone cream is on MCD pdl. 11/09/22  Yes Banister, Janace Aris, MD  omeprazole (PRILOSEC) 20 MG capsule Take 1 capsule (20 mg total) by mouth daily. 07/19/22  Yes Wallis Bamberg, PA-C  promethazine-dextromethorphan (PROMETHAZINE-DM) 6.25-15 MG/5ML syrup Take 5 mLs by mouth 4 (four) times daily as needed for cough. 04/27/23  Yes Radford Pax, NP  WEGOVY 2.4 MG/0.75ML SOAJ Inject 2.4 mg into the skin once a week. 04/09/23  Yes   DULoxetine (CYMBALTA) 20 MG capsule Take by mouth. 08/23/22   [provider]  levocetirizine (XYZAL) 5 MG tablet Take 1 tablet (5 mg total) by mouth every evening. 12/03/21   Wallis Bamberg, PA-C  sertraline (ZOLOFT) 25 MG tablet Take 1 tablet (25 mg total) by mouth daily. 01/12/22 07/11/22  Theadora Rama Scales, PA-C  silver sulfADIAZINE (SILVADENE) 1 % cream Apply 1 Application topically 2 (two) times daily. 08/25/22   Wallis Bamberg, PA-C  tiZANidine (ZANAFLEX) 4 MG tablet Take 1 tablet (4 mg total) by mouth every 8 (eight) hours as needed for muscle spasms. 03/30/23   Zenia Resides, MD    Family History Family History  Problem Relation Age of Onset   Asthma Mother  Hypertension Mother     Social History Social History   Tobacco Use   Smoking status: Every Day    Current packs/day: 0.50    Types: Cigarettes   Smokeless tobacco: Never  Vaping Use   Vaping status: Some Days  Substance Use Topics   Alcohol use: Not Currently   Drug use: Never     Allergies   Ibuprofen, Influenza vaccines, Shellfish allergy, and Wild lettuce extract (lactuca virosa)   Review of Systems Review of Systems  HENT:  Positive for congestion, ear pain and sore throat.   Respiratory:  Positive for cough and wheezing.      Physical Exam Triage Vital Signs ED Triage Vitals  Encounter Vitals Group     BP 04/27/23 0948 111/73      Systolic BP Percentile --      Diastolic BP Percentile --      Pulse Rate 04/27/23 0948 95     Resp 04/27/23 0948 18     Temp 04/27/23 0948 98.3 F (36.8 C)     Temp Source 04/27/23 0948 Oral     SpO2 04/27/23 0948 94 %     Weight --      Height 04/27/23 0950 5' (1.524 m)     Head Circumference --      Peak Flow --      Pain Score 04/27/23 0949 6     Pain Loc --      Pain Education --      Exclude from Growth Chart --    No data found.  Updated Vital Signs BP 111/73 (BP Location: Right Arm)   Pulse 95   Temp 98.3 F (36.8 C) (Oral)   Resp 18   Ht 5' (1.524 m)   LMP 03/04/2023   SpO2 94%   BMI 41.99 kg/m   Visual Acuity Right Eye Distance:   Left Eye Distance:   Bilateral Distance:    Right Eye Near:   Left Eye Near:    Bilateral Near:     Physical Exam Vitals and nursing note reviewed.  Constitutional:      General: She is not in acute distress.    Appearance: She is well-developed. She is not ill-appearing.  HENT:     Head: Normocephalic and atraumatic.     Right Ear: Tympanic membrane and ear canal normal.     Left Ear: Tympanic membrane and ear canal normal.     Nose: Congestion present.     Mouth/Throat:     Mouth: Mucous membranes are moist.     Pharynx: Oropharynx is clear. Uvula midline. Posterior oropharyngeal erythema present.     Tonsils: No tonsillar exudate or tonsillar abscesses.  Eyes:     Conjunctiva/sclera: Conjunctivae normal.     Pupils: Pupils are equal, round, and reactive to light.  Cardiovascular:     Rate and Rhythm: Normal rate and regular rhythm.     Heart sounds: Normal heart sounds.  Pulmonary:     Effort: Pulmonary effort is normal.     Breath sounds: Normal breath sounds. No wheezing.  Musculoskeletal:     Cervical back: Normal range of motion and neck supple.  Lymphadenopathy:     Cervical: No cervical adenopathy.  Skin:    General: Skin is warm and dry.  Neurological:     General: No focal deficit present.      Mental Status: She is alert and oriented to person, place, and time.  Psychiatric:  Mood and Affect: Mood normal.        Behavior: Behavior normal.      UC Treatments / Results  Labs (all labs ordered are listed, but only abnormal results are displayed) Labs Reviewed  CULTURE, GROUP A STREP [facility])  POCT RAPID STREP A (OFFICE)  POC COVID19/FLU A&B COMBO    EKG   Radiology No results found.  Procedures Procedures (including critical care time)  Medications Ordered in UC Medications - No data to display  Initial Impression / Assessment and Plan / UC Course  I have reviewed the triage vital signs and the nursing notes.  Pertinent labs & imaging results that were available during my care of the patient were reviewed by me and considered in my medical decision making (see chart for details).     Reviewed exam and symptoms with patient.  No red flags.  Negative rapid strep, flu, COVID testing.  Will send throat culture.  Discussed viral illness and symptomatic treatment.  Promethazine DM as needed for cough, side effect profile reviewed.  Flonase daily.  Albuterol inhaler as needed for wheezing.  PCP follow-up 2 to 3 days for recheck.  ER precautions reviewed. Final Clinical Impressions(s) / UC Diagnoses   Final diagnoses:  Sore throat  Acute cough  Mild intermittent asthma with acute exacerbation  Viral illness     Discharge Instructions      Start Promethazine DM as needed for cough.  Please note this medication can make you drowsy.  Do not drink alcohol or drive on this medication.  Flonase daily to help with your ear pain.  Albuterol inhaler to help with your wheezing or shortness of breath.  Lots of rest and fluids.  Please follow-up with your PCP in 2 to 3 days for recheck.  Please go to the ER if you develop any worsening symptoms.  I hope you feel better soon!     ED Prescriptions     Medication Sig Dispense Auth. Provider    promethazine-dextromethorphan (PROMETHAZINE-DM) 6.25-15 MG/5ML syrup Take 5 mLs by mouth 4 (four) times daily as needed for cough. 118 mL Radford Pax, NP   fluticasone (FLONASE) 50 MCG/ACT nasal spray Place 1 spray into both nostrils daily. 15.8 mL Radford Pax, NP   albuterol (VENTOLIN HFA) 108 (90 Base) MCG/ACT inhaler Inhale 1-2 puffs into the lungs every 6 (six) hours as needed for wheezing or shortness of breath. 1 each Radford Pax, NP      PDMP not reviewed this encounter.   Radford Pax, NP 04/27/23 1039

## 2023-04-27 NOTE — ED Triage Notes (Signed)
Patient c/o sore throat and stuffy nose x 2 days.  Slight cough, afebrile.  Patient has taken Dayquil.

## 2023-04-30 LAB — CULTURE, GROUP A STREP (THRC)

## 2023-05-07 ENCOUNTER — Other Ambulatory Visit (HOSPITAL_COMMUNITY): Payer: Self-pay

## 2023-05-07 MED ORDER — ONDANSETRON HCL 4 MG PO TABS
ORAL_TABLET | ORAL | 0 refills | Status: DC
  Filled 2023-05-07: qty 30, 10d supply, fill #0

## 2023-05-07 MED ORDER — WEGOVY 2.4 MG/0.75ML ~~LOC~~ SOAJ
SUBCUTANEOUS | 2 refills | Status: DC
  Filled 2023-05-07 (×2): qty 3, 28d supply, fill #0

## 2023-05-21 ENCOUNTER — Other Ambulatory Visit (HOSPITAL_COMMUNITY): Payer: Self-pay

## 2023-05-25 ENCOUNTER — Other Ambulatory Visit: Payer: Self-pay

## 2023-05-25 ENCOUNTER — Ambulatory Visit
Admission: EM | Admit: 2023-05-25 | Discharge: 2023-05-25 | Disposition: A | Discharge status: Home / Self Care | Payer: Medicaid Other | Attending: Family Medicine | Admitting: Family Medicine

## 2023-05-25 DIAGNOSIS — R112 Nausea with vomiting, unspecified: Secondary | ICD-10-CM

## 2023-05-25 DIAGNOSIS — K529 Noninfective gastroenteritis and colitis, unspecified: Secondary | ICD-10-CM

## 2023-05-25 MED ORDER — ONDANSETRON 4 MG PO TBDP
4.0000 mg | ORAL_TABLET | Freq: Once | ORAL | Status: AC
  Administered 2023-05-25: 4 mg via ORAL

## 2023-05-25 NOTE — ED Provider Notes (Signed)
[facility]    CSN: [id number] Arrival date & time: 05/25/23  1845      History   Chief Complaint Chief Complaint  Patient presents with   Abdominal Pain   Emesis   Diarrhea    HPI Robin Mcbride is a 38 y.o. female presents for nausea vomiting and diarrhea.  Patient reports 3 to 4 hours prior to arrival she began having nonbilious nonbloody nausea, vomiting, diarrhea.  States she has had about 5 episodes of each.  Unable to keep any food or fluids down at this point.  States symptoms began after eating [employer] chicken.  No fevers, URI symptoms.  No recent travel.  No sick contacts.  She does take [facility] but this is not a new medication.  She did take 4 mg sublingual Zofran but she thinks she threw up most of it.  Has a history of GERD otherwise no GI diagnoses such as Crohn's, IBS, colitis.  No other concerns at this time   Abdominal Pain Associated symptoms: diarrhea, nausea and vomiting   Emesis Associated symptoms: abdominal pain and diarrhea   Diarrhea Associated symptoms: abdominal pain and vomiting     Past Medical History:  Diagnosis Date   Anemia    Asthma    Asthma    Heart murmur     There are no active problems to display for this patient.   Past Surgical History:  Procedure Laterality Date   BREAST SURGERY      OB History   No obstetric history on file.      Home Medications    Prior to Admission medications   Medication Sig Start Date End Date Taking? Authorizing Provider  albuterol (VENTOLIN HFA) 108 (90 Base) MCG/ACT inhaler Inhale 1-2 puffs into the lungs every 6 (six) hours as needed for wheezing or shortness of breath. 04/27/23   Radford Pax, NP  buPROPion (WELLBUTRIN XL) 150 MG 24 hr tablet Take by mouth. 08/23/22   [provider]  diclofenac (VOLTAREN) 75 MG EC tablet Take 1 tablet (75 mg total) by mouth 2 (two) times daily as needed (pain). 03/30/23   Zenia Resides, MD  docusate sodium (COLACE) 100 MG  capsule Take 1 capsule (100 mg total) by mouth every 12 (twelve) hours. 07/19/22   Wallis Bamberg, PA-C  DULoxetine (CYMBALTA) 20 MG capsule Take by mouth. 08/23/22   [provider]  famotidine (PEPCID) 20 MG tablet Take 1 tablet (20 mg total) by mouth 2 (two) times daily for 15 days. 07/19/22 04/27/23  Wallis Bamberg, PA-C  fluticasone (FLONASE) 50 MCG/ACT nasal spray Place 1 spray into both nostrils daily. 04/27/23   Radford Pax, NP  levocetirizine (XYZAL) 5 MG tablet Take 1 tablet (5 mg total) by mouth every evening. 12/03/21   Wallis Bamberg, PA-C  mometasone (ELOCON) 0.1 % cream Apply 1 Application topically daily. To affected area till better. Please cancel other steroid creams. Mometasone cream is on MCD pdl. 11/09/22   Zenia Resides, MD  omeprazole (PRILOSEC) 20 MG capsule Take 1 capsule (20 mg total) by mouth daily. 07/19/22   Wallis Bamberg, PA-C  ondansetron (ZOFRAN) 4 MG tablet Take one tablet (4 mg dose) by mouth every 8 (eight) hours as needed for Nausea. 05/07/23     promethazine-dextromethorphan (PROMETHAZINE-DM) 6.25-15 MG/5ML syrup Take 5 mLs by mouth 4 (four) times daily as needed for cough. 04/27/23   Radford Pax, NP  sertraline (ZOLOFT) 25 MG tablet Take 1 tablet (25  mg total) by mouth daily. 01/12/22 07/11/22  Theadora Rama Scales, PA-C  silver sulfADIAZINE (SILVADENE) 1 % cream Apply 1 Application topically 2 (two) times daily. 08/25/22   Wallis Bamberg, PA-C  tiZANidine (ZANAFLEX) 4 MG tablet Take 1 tablet (4 mg total) by mouth every 8 (eight) hours as needed for muscle spasms. 03/30/23   Zenia Resides, MD  WEGOVY 2.4 MG/0.75ML SOAJ Inject 2.4 mg into the skin once a week. 05/07/23       Family History Family History  Problem Relation Age of Onset   Asthma Mother    Hypertension Mother     Social History Social History   Tobacco Use   Smoking status: Every Day    Current packs/day: 0.50    Types: Cigarettes   Smokeless tobacco: Never  Vaping Use   Vaping status:  Some Days  Substance Use Topics   Alcohol use: Not Currently   Drug use: Never     Allergies   Ibuprofen, Influenza vaccines, Shellfish allergy, and Wild lettuce extract (lactuca virosa)   Review of Systems Review of Systems  Gastrointestinal:  Positive for abdominal pain, diarrhea, nausea and vomiting.     Physical Exam Triage Vital Signs ED Triage Vitals  Encounter Vitals Group     BP 05/25/23 1913 (!) 129/90     Systolic BP Percentile --      Diastolic BP Percentile --      Pulse Rate 05/25/23 1913 76     Resp 05/25/23 1913 18     Temp 05/25/23 1913 97.9 F (36.6 C)     Temp Source 05/25/23 1913 Oral     SpO2 05/25/23 1913 98 %     Weight --      Height --      Head Circumference --      Peak Flow --      Pain Score 05/25/23 1911 9     Pain Loc --      Pain Education --      Exclude from Growth Chart --    No data found.  Updated Vital Signs BP (!) 129/90 (BP Location: Right Arm)   Pulse 76   Temp 97.9 F (36.6 C) (Oral)   Resp 18   LMP 05/05/2023 (Exact Date)   SpO2 98%   Visual Acuity Right Eye Distance:   Left Eye Distance:   Bilateral Distance:    Right Eye Near:   Left Eye Near:    Bilateral Near:     Physical Exam Vitals and nursing note reviewed.  Constitutional:      General: She is not in acute distress.    Appearance: Normal appearance. She is obese. She is not ill-appearing, toxic-appearing or diaphoretic.  HENT:     Head: Normocephalic and atraumatic.  Eyes:     Pupils: Pupils are equal, round, and reactive to light.  Cardiovascular:     Rate and Rhythm: Normal rate.  Pulmonary:     Effort: Pulmonary effort is normal.  Abdominal:     General: Bowel sounds are increased.     Palpations: Abdomen is soft. There is no hepatomegaly or splenomegaly.     Tenderness: There is generalized abdominal tenderness. Negative signs include Rovsing's sign and McBurney's sign.  Skin:    General: Skin is warm and dry.  Neurological:      General: No focal deficit present.     Mental Status: She is alert and oriented to person, place, and time.  Psychiatric:  Mood and Affect: Mood normal.        Behavior: Behavior normal.      UC Treatments / Results  Labs (all labs ordered are listed, but only abnormal results are displayed) Labs Reviewed - No data to display  EKG   Radiology No results found.  Procedures Procedures (including critical care time)  Medications Ordered in UC Medications  ondansetron (ZOFRAN-ODT) disintegrating tablet 4 mg (4 mg Oral Given 05/25/23 1929)    Initial Impression / Assessment and Plan / UC Course  I have reviewed the triage vital signs and the nursing notes.  Pertinent labs & imaging results that were available during my care of the patient were reviewed by me and considered in my medical decision making (see chart for details).     Reviewed exam and symptoms with patient, no red flags.  Discussed food poisoning versus viral enteritis.  Patient given additional dose of Zofran in clinic.  She has a prescription home and can take every 8 hours as needed.  Advised to focus on hydration/electrolyte replacement.  Brat diet if she tries to eat anything.  PCP follow-up 2 to 3 days for recheck.  Strict ER precautions reviewed and patient verbalized understanding Final Clinical Impressions(s) / UC Diagnoses   Final diagnoses:  Nausea and vomiting, unspecified vomiting type  Gastroenteritis     Discharge Instructions      May continue Zofran every 8 hours as needed for nausea or vomiting.  Focus on hydration and electrolyte replacement with Gatorade, Powerade, Pedialyte, water.  If you do decide to try to eat to a bland diet such as bananas, rice, applesauce, toast.  Follow-up with your PCP in 2 to 3 days for recheck.  Please go to the ER if you develop any worsening symptoms.  This includes but is not limited to fever, blood in the vomit or stool, severe abdominal pain, or any new  concerns that arise.  I hope you feel better soon!     ED Prescriptions   None    PDMP not reviewed this encounter.   Radford Pax, NP 05/25/23 1932

## 2023-05-25 NOTE — Discharge Instructions (Addendum)
May continue Zofran every 8 hours as needed for nausea or vomiting.  Focus on hydration and electrolyte replacement with Gatorade, Powerade, Pedialyte, water.  If you do decide to try to eat to a bland diet such as bananas, rice, applesauce, toast.  Follow-up with your PCP in 2 to 3 days for recheck.  Please go to the ER if you develop any worsening symptoms.  This includes but is not limited to fever, blood in the vomit or stool, severe abdominal pain, or any new concerns that arise.  I hope you feel better soon!

## 2023-05-25 NOTE — ED Triage Notes (Signed)
Pt is here with NVD that started at 4pm today, pt has taken OTC meds to relieve discomfort.

## 2023-05-28 ENCOUNTER — Other Ambulatory Visit: Payer: Self-pay

## 2023-05-28 ENCOUNTER — Encounter (HOSPITAL_BASED_OUTPATIENT_CLINIC_OR_DEPARTMENT_OTHER): Payer: Self-pay

## 2023-05-28 DIAGNOSIS — R1110 Vomiting, unspecified: Secondary | ICD-10-CM | POA: Diagnosis not present

## 2023-05-28 DIAGNOSIS — R197 Diarrhea, unspecified: Secondary | ICD-10-CM | POA: Diagnosis not present

## 2023-05-28 DIAGNOSIS — R1010 Upper abdominal pain, unspecified: Secondary | ICD-10-CM | POA: Diagnosis not present

## 2023-05-28 DIAGNOSIS — R109 Unspecified abdominal pain: Secondary | ICD-10-CM | POA: Diagnosis present

## 2023-05-28 LAB — CBC
HCT: 36.4 % (ref 36.0–46.0)
Hemoglobin: 11.9 g/dL — ABNORMAL LOW (ref 12.0–15.0)
MCH: 26.2 pg (ref 26.0–34.0)
MCHC: 32.7 g/dL (ref 30.0–36.0)
MCV: 80 fL (ref 80.0–100.0)
Platelets: 389 10*3/uL (ref 150–400)
RBC: 4.55 MIL/uL (ref 3.87–5.11)
RDW: 14.8 % (ref 11.5–15.5)
WBC: 6.5 10*3/uL (ref 4.0–10.5)
nRBC: 0 % (ref 0.0–0.2)

## 2023-05-28 LAB — LIPASE, BLOOD: Lipase: 55 U/L — ABNORMAL HIGH (ref 11–51)

## 2023-05-28 LAB — COMPREHENSIVE METABOLIC PANEL
ALT: 17 U/L (ref 0–44)
AST: 17 U/L (ref 15–41)
Albumin: 3.8 g/dL (ref 3.5–5.0)
Alkaline Phosphatase: 62 U/L (ref 38–126)
Anion gap: 11 (ref 5–15)
BUN: 11 mg/dL (ref 6–20)
CO2: 18 mmol/L — ABNORMAL LOW (ref 22–32)
Calcium: 9 mg/dL (ref 8.9–10.3)
Chloride: 108 mmol/L (ref 98–111)
Creatinine, Ser: 0.51 mg/dL (ref 0.44–1.00)
GFR, Estimated: >60 mL/min (ref >60)
Glucose, Bld: 90 mg/dL (ref 70–99)
Potassium: 3.6 mmol/L (ref 3.5–5.1)
Sodium: 137 mmol/L (ref 135–145)
Total Bilirubin: 0.6 mg/dL (ref 0.0–1.2)
Total Protein: 7.3 g/dL (ref 6.5–8.1)

## 2023-05-28 NOTE — ED Triage Notes (Signed)
Pt states that she was seen at [facility] on Friday. States that she is having abdominal pain. States that her burps smell like "rotten eggs"  States that she is still having nausea, diarrhea and pain.

## 2023-05-29 ENCOUNTER — Emergency Department (HOSPITAL_BASED_OUTPATIENT_CLINIC_OR_DEPARTMENT_OTHER)
Admission: EM | Admit: 2023-05-29 | Discharge: 2023-05-29 | Disposition: A | Discharge status: Home / Self Care | Payer: Medicaid Other | Attending: Emergency Medicine | Admitting: Emergency Medicine

## 2023-05-29 ENCOUNTER — Emergency Department (HOSPITAL_BASED_OUTPATIENT_CLINIC_OR_DEPARTMENT_OTHER): Payer: Medicaid Other

## 2023-05-29 DIAGNOSIS — R1010 Upper abdominal pain, unspecified: Secondary | ICD-10-CM

## 2023-05-29 DIAGNOSIS — R1110 Vomiting, unspecified: Secondary | ICD-10-CM

## 2023-05-29 LAB — PREGNANCY, URINE: Preg Test, Ur: NEGATIVE

## 2023-05-29 MED ORDER — ONDANSETRON 4 MG PO TBDP
4.0000 mg | ORAL_TABLET | Freq: Once | ORAL | Status: AC
  Administered 2023-05-29: 4 mg via ORAL
  Filled 2023-05-29: qty 1

## 2023-05-29 MED ORDER — ACETAMINOPHEN 325 MG PO TABS
650.0000 mg | ORAL_TABLET | Freq: Once | ORAL | Status: AC
  Administered 2023-05-29: 650 mg via ORAL
  Filled 2023-05-29: qty 2

## 2023-05-29 MED ORDER — OMEPRAZOLE 20 MG PO CPDR
20.0000 mg | DELAYED_RELEASE_CAPSULE | Freq: Every day | ORAL | 0 refills | Status: AC
Start: 2023-05-29 — End: ?

## 2023-05-29 MED ORDER — LOPERAMIDE HCL 2 MG PO CAPS: 2.0000 mg | ORAL_CAPSULE | Freq: Four times a day (QID) | ORAL | 0 refills | Status: AC | PRN

## 2023-05-29 MED ORDER — ONDANSETRON HCL 4 MG PO TABS: ORAL_TABLET | ORAL | 0 refills | Status: AC

## 2023-05-29 NOTE — ED Provider Notes (Signed)
[facility] Provider Note   CSN: [id number] Arrival date & time: 05/28/23  1826     History  Chief Complaint  Patient presents with   Abdominal Pain    Robin Mcbride is a 38 y.o. female.   Abdominal Pain  Patient states she has been having trouble with vomiting, and diarrhea ongoing since last week.  Patient states she has had a lot of burping and belching and it smells like rotten eggs.  She is having abdominal cramping.  Patient initially started having symptoms on Friday.  Patient states she went to an urgent care on Friday.  She was told she could have food poisoning.  Patient was given prescription for Zofran.  She was instructed to come to the ER if her symptoms were not improving.  Patient has not had any fevers.  She is having diffuse abdominal cramping when she has her vomiting.  She will have periods where she is feeling better but then will start having the nausea and vomiting again.  She had about 6 episodes of loose stools yesterday.  She has not been on any antibiotics.  Patient does take 60.  This is not a new medication however she did have her dose increased last week    Home Medications Prior to Admission medications   Medication Sig Start Date End Date Taking? Authorizing Provider  loperamide (IMODIUM) 2 MG capsule Take 1 capsule (2 mg total) by mouth 4 (four) times daily as needed for diarrhea or loose stools. 05/29/23  Yes Linwood Dibbles, MD  albuterol (VENTOLIN HFA) 108 (90 Base) MCG/ACT inhaler Inhale 1-2 puffs into the lungs every 6 (six) hours as needed for wheezing or shortness of breath. 04/27/23   Radford Pax, NP  buPROPion (WELLBUTRIN XL) 150 MG 24 hr tablet Take by mouth. 08/23/22   [provider]  diclofenac (VOLTAREN) 75 MG EC tablet Take 1 tablet (75 mg total) by mouth 2 (two) times daily as needed (pain). 03/30/23   Zenia Resides, MD  docusate sodium (COLACE) 100 MG capsule Take 1 capsule  (100 mg total) by mouth every 12 (twelve) hours. 07/19/22   Wallis Bamberg, PA-C  DULoxetine (CYMBALTA) 20 MG capsule Take by mouth. 08/23/22   [provider]  famotidine (PEPCID) 20 MG tablet Take 1 tablet (20 mg total) by mouth 2 (two) times daily for 15 days. 07/19/22 04/27/23  Wallis Bamberg, PA-C  fluticasone (FLONASE) 50 MCG/ACT nasal spray Place 1 spray into both nostrils daily. 04/27/23   Radford Pax, NP  levocetirizine (XYZAL) 5 MG tablet Take 1 tablet (5 mg total) by mouth every evening. 12/03/21   Wallis Bamberg, PA-C  mometasone (ELOCON) 0.1 % cream Apply 1 Application topically daily. To affected area till better. Please cancel other steroid creams. Mometasone cream is on MCD pdl. 11/09/22   Zenia Resides, MD  omeprazole (PRILOSEC) 20 MG capsule Take 1 capsule (20 mg total) by mouth daily. 05/29/23   Linwood Dibbles, MD  ondansetron (ZOFRAN) 4 MG tablet Take one tablet (4 mg dose) by mouth every 8 (eight) hours as needed for Nausea. 05/29/23   Linwood Dibbles, MD  promethazine-dextromethorphan (PROMETHAZINE-DM) 6.25-15 MG/5ML syrup Take 5 mLs by mouth 4 (four) times daily as needed for cough. 04/27/23   Radford Pax, NP  sertraline (ZOLOFT) 25 MG tablet Take 1 tablet (25 mg total) by mouth daily. 01/12/22 07/11/22  Theadora Rama Scales, PA-C  silver sulfADIAZINE (SILVADENE) 1 %  cream Apply 1 Application topically 2 (two) times daily. 08/25/22   Wallis Bamberg, PA-C  tiZANidine (ZANAFLEX) 4 MG tablet Take 1 tablet (4 mg total) by mouth every 8 (eight) hours as needed for muscle spasms. 03/30/23   Zenia Resides, MD  WEGOVY 2.4 MG/0.75ML SOAJ Inject 2.4 mg into the skin once a week. 05/07/23         Allergies    Ibuprofen, Influenza vaccines, Shellfish allergy, and Wild lettuce extract (lactuca virosa)    Review of Systems   Review of Systems  Gastrointestinal:  Positive for abdominal pain.    Physical Exam Updated Vital Signs BP 132/79 (BP Location: Right Arm)   Pulse (!) 101   Temp 98.5  F (36.9 C)   Resp 18   Ht 1.524 m (5')   Wt 92.1 kg   LMP 05/05/2023 (Exact Date)   SpO2 98%   BMI 39.65 kg/m  Physical Exam Vitals and nursing note reviewed.  Constitutional:      General: She is not in acute distress.    Appearance: She is well-developed.  HENT:     Head: Normocephalic and atraumatic.     Right Ear: External ear normal.     Left Ear: External ear normal.  Eyes:     General: No scleral icterus.       Right eye: No discharge.        Left eye: No discharge.     Conjunctiva/sclera: Conjunctivae normal.  Neck:     Trachea: No tracheal deviation.  Cardiovascular:     Rate and Rhythm: Normal rate and regular rhythm.  Pulmonary:     Effort: Pulmonary effort is normal. No respiratory distress.     Breath sounds: Normal breath sounds. No stridor. No wheezing or rales.  Abdominal:     General: Bowel sounds are normal. There is no distension.     Palpations: Abdomen is soft.     Tenderness: There is no abdominal tenderness. There is no guarding or rebound.  Musculoskeletal:        General: No tenderness or deformity.     Cervical back: Neck supple.  Skin:    General: Skin is warm and dry.     Findings: No rash.  Neurological:     General: No focal deficit present.     Mental Status: She is alert.     Cranial Nerves: No cranial nerve deficit, dysarthria or facial asymmetry.     Sensory: No sensory deficit.     Motor: No abnormal muscle tone or seizure activity.     Coordination: Coordination normal.  Psychiatric:        Mood and Affect: Mood normal.     ED Results / Procedures / Treatments   Labs (all labs ordered are listed, but only abnormal results are displayed) Labs Reviewed  LIPASE, BLOOD - Abnormal; Notable for the following components:      Result Value   Lipase 55 (*)    All other components within normal limits  COMPREHENSIVE METABOLIC PANEL - Abnormal; Notable for the following components:   CO2 18 (*)    All other components within  normal limits  CBC - Abnormal; Notable for the following components:   Hemoglobin 11.9 (*)    All other components within normal limits  PREGNANCY, URINE  URINALYSIS, ROUTINE W REFLEX MICROSCOPIC    EKG None  Radiology US Abdomen Limited Result Date: 05/29/2023 CLINICAL DATA:  Abdominal pain EXAM: ULTRASOUND ABDOMEN LIMITED RIGHT UPPER QUADRANT  COMPARISON:  None Available. FINDINGS: Gallbladder: Gallbladder is well distended. 3 mm polyp is noted. No wall thickening or pericholecystic fluid is noted. Common bile duct: Diameter: 2.7 mm. Liver: No focal lesion identified. Within normal limits in parenchymal echogenicity. Portal vein is patent on color Doppler imaging with normal direction of blood flow towards the liver. Other: None. IMPRESSION: 3 mm gallbladder polyp. 1.  ?6 mm: no further evaluation or follow up necessary. 2. 7-9 mm: yearly follow up with ultrasound to ensure no interval growth. 3. ?10 mm: surgical consultation for cholecystectomy, if no cholecystectomy, annual follow up is justified. Per consensus guidelines, this requires no additional evaluation or specific follow-up. This recommendation follows ACR consensus guidelines: White Paper of the ACR Incidental findings Committee II on Gallbladder and Biliary Findings. J Am Coll Radiol 2013:;10:953-956. No other focal abnormality is noted. Electronically Signed   By: Alcide Clever M.D.   On: 05/29/2023 01:15    Procedures Procedures    Medications Ordered in ED Medications  acetaminophen (TYLENOL) tablet 650 mg (650 mg Oral Given 05/29/23 0138)  ondansetron (ZOFRAN-ODT) disintegrating tablet 4 mg (4 mg Oral Given 05/29/23 0804)  acetaminophen (TYLENOL) tablet 650 mg (650 mg Oral Given 05/29/23 0802)    ED Course/ Medical Decision Making/ A&P Clinical Course as of 05/29/23 0844  Tue May 29, 2023  0817 CBC(!) Labs reviewed CBC normal.  Metabolic panel normal except bicarb decreased at 18.  Lipase slightly increased at 55.   Pregnancy test negative [JK]  0818 Ultrasound shows gallbladder polyp no signs of cholecystitis [JK]    Clinical Course User Index [JK] Linwood Dibbles, MD                                 Medical Decision Making Amount and/or Complexity of Data Reviewed Labs: ordered. Radiology: ordered.  Risk OTC drugs. Prescription drug management.   Patient's ED workup is reassuring.  No findings to suggest hepatitis or pancreatitis.  Her lipase is mildly elevated but I do not think that is clinically significant.  Urinalysis was ordered but it was not completed during the patient's 12-hour wait to be evaluated.  She is not having any urinary frequency or urgency.  I do not feel that she needs to wait to have a urinalysis performed at this time.  Patient's ultrasound does not show any signs of cholecystitis.  Incidental gallbladder polyp noted.  Patient appears well in the ED.  No significant abdominal tenderness.  Will have her take Zofran and Imodium.  Will also have her continue her Protonix.  It is possible her increased dose of Wegovy is causing her symptoms.  Will have her hold her dose today and talk to her doctor about returning back to her lower dose.  Evaluation and diagnostic testing in the emergency department does not suggest an emergent condition requiring admission or immediate intervention beyond what has been performed at this time.  The patient is safe for discharge and has been instructed to return immediately for worsening symptoms, change in symptoms or any other concerns.         Final Clinical Impression(s) / ED Diagnoses Final diagnoses:  Pain of upper abdomen  Vomiting and diarrhea    Rx / DC Orders ED Discharge Orders          Ordered    omeprazole (PRILOSEC) 20 MG capsule  Daily        05/29/23 0839  ondansetron (ZOFRAN) 4 MG tablet        05/29/23 0839    loperamide (IMODIUM) 2 MG capsule  4 times daily PRN        05/29/23 2956              Linwood Dibbles,  MD 05/29/23 [phone]

## 2023-05-29 NOTE — ED Notes (Signed)
Pt unable to void at this time.

## 2023-05-29 NOTE — Discharge Instructions (Signed)
Your laboratory test and ultrasound did not show any signs of acute infection, liver problems, pancreas problems or gallstones.  Take the Zofran and Imodium to help with the vomiting and diarrhea.  It is possible the increased dose of Wegovy could be contributing to your symptoms.  Hold your dose today and talk to your doctor about whether you should decrease back to your lower dose.

## 2023-06-05 ENCOUNTER — Other Ambulatory Visit (HOSPITAL_COMMUNITY): Payer: Self-pay

## 2023-06-05 MED ORDER — WEGOVY 1.7 MG/0.75ML ~~LOC~~ SOAJ
SUBCUTANEOUS | 0 refills | Status: AC
  Filled 2023-06-05: qty 3, 28d supply, fill #0

## 2023-07-01 ENCOUNTER — Ambulatory Visit
Admission: EM | Admit: 2023-07-01 | Discharge: 2023-07-01 | Disposition: A | Discharge status: Home / Self Care | Attending: Family Medicine | Admitting: Family Medicine

## 2023-07-01 DIAGNOSIS — R112 Nausea with vomiting, unspecified: Secondary | ICD-10-CM | POA: Diagnosis not present

## 2023-07-01 DIAGNOSIS — T887XXA Unspecified adverse effect of drug or medicament, initial encounter: Secondary | ICD-10-CM

## 2023-07-01 MED ORDER — MAALOX MAX 400-400-40 MG/5ML PO SUSP: 10.0000 mL | Freq: Four times a day (QID) | ORAL | 0 refills | Status: AC | PRN

## 2023-07-01 MED ORDER — ONDANSETRON 8 MG PO TBDP: 8.0000 mg | ORAL_TABLET | Freq: Three times a day (TID) | ORAL | 0 refills | Status: AC | PRN

## 2023-07-01 NOTE — ED Provider Notes (Signed)
 [facility]  Note:  This document was prepared using Conservation officer, historic buildings and may include unintentional dictation errors.  MRN: [id number] DOB: 08-Apr-1986  Subjective:   Robin Mcbride is a 38 y.o. female presenting for 70-month history of persistent intermittent abdominal pain, internal burning sensation, burping, diarrhea.  Patient has been off of Wegovy and has since had these particular symptoms.  Has been advised by her PCP to stop the medication but patient continues taking it.  She has had extensive workup through the emergency room most of which has been negative.  Denies any bloody stools, hematemesis.  Results from her emergency room visit obtained and included below.  Reviewed this with patient as well as she states that she was unaware of her results.  No current facility-administered medications for this encounter.  Current Outpatient Medications:    albuterol (VENTOLIN HFA) 108 (90 Base) MCG/ACT inhaler, Inhale 1-2 puffs into the lungs every 6 (six) hours as needed for wheezing or shortness of breath., Disp: 1 each, Rfl: 0   buPROPion (WELLBUTRIN XL) 150 MG 24 hr tablet, Take by mouth., Disp: , Rfl:    diclofenac (VOLTAREN) 75 MG EC tablet, Take 1 tablet (75 mg total) by mouth 2 (two) times daily as needed (pain)., Disp: 20 tablet, Rfl: 0   docusate sodium (COLACE) 100 MG capsule, Take 1 capsule (100 mg total) by mouth every 12 (twelve) hours., Disp: 60 capsule, Rfl: 0   DULoxetine (CYMBALTA) 20 MG capsule, Take by mouth., Disp: , Rfl:    famotidine (PEPCID) 20 MG tablet, Take 1 tablet (20 mg total) by mouth 2 (two) times daily for 15 days., Disp: 30 tablet, Rfl: 0   fluticasone (FLONASE) 50 MCG/ACT nasal spray, Place 1 spray into both nostrils daily., Disp: 15.8 mL, Rfl: 0   levocetirizine (XYZAL) 5 MG tablet, Take 1 tablet (5 mg total) by mouth every evening., Disp: 90 tablet, Rfl: 0   loperamide (IMODIUM) 2 MG capsule, Take 1 capsule (2  mg total) by mouth 4 (four) times daily as needed for diarrhea or loose stools., Disp: 12 capsule, Rfl: 0   mometasone (ELOCON) 0.1 % cream, Apply 1 Application topically daily. To affected area till better. Please cancel other steroid creams. Mometasone cream is on MCD pdl., Disp: 45 g, Rfl: 0   omeprazole (PRILOSEC) 20 MG capsule, Take 1 capsule (20 mg total) by mouth daily., Disp: 90 capsule, Rfl: 0   ondansetron (ZOFRAN) 4 MG tablet, Take one tablet (4 mg dose) by mouth every 8 (eight) hours as needed for Nausea., Disp: 30 tablet, Rfl: 0   promethazine-dextromethorphan (PROMETHAZINE-DM) 6.25-15 MG/5ML syrup, Take 5 mLs by mouth 4 (four) times daily as needed for cough., Disp: 118 mL, Rfl: 0   sertraline (ZOLOFT) 25 MG tablet, Take 1 tablet (25 mg total) by mouth daily., Disp: 90 tablet, Rfl: 1   silver sulfADIAZINE (SILVADENE) 1 % cream, Apply 1 Application topically 2 (two) times daily., Disp: 200 g, Rfl: 0   tiZANidine (ZANAFLEX) 4 MG tablet, Take 1 tablet (4 mg total) by mouth every 8 (eight) hours as needed for muscle spasms., Disp: 15 tablet, Rfl: 0   WEGOVY 1.7 MG/0.75ML SOAJ, Inject 0.75 mLs (1.7 mg dose) into the skin once a week., Disp: 3 mL, Rfl: 0   Allergies  Allergen Reactions   Ibuprofen Hives, Itching and Other (See Comments)    brand   Influenza Vaccines Rash    If given, needs to be nasal  route but monitor.   If given, needs to be nasal route but monitor.   Shellfish Allergy Rash   Wild Lettuce Extract (Lactuca Virosa) Hives    Past Medical History:  Diagnosis Date   Anemia    Asthma    Asthma    Heart murmur      Past Surgical History:  Procedure Laterality Date   BREAST SURGERY      Family History  Problem Relation Age of Onset   Asthma Mother    Hypertension Mother     Social History   Tobacco Use   Smoking status: Every Day    Current packs/day: 0.50    Types: Cigarettes   Smokeless tobacco: Never  Vaping Use   Vaping status: Some Days   Substance Use Topics   Alcohol use: Not Currently   Drug use: Never    ROS   Objective:   Vitals: There were no vitals taken for this visit.  Physical Exam Constitutional:      General: She is not in acute distress.    Appearance: Normal appearance. She is well-developed. She is obese. She is not ill-appearing, toxic-appearing or diaphoretic.  HENT:     Head: Normocephalic and atraumatic.     Nose: Nose normal.     Mouth/Throat:     Mouth: Mucous membranes are moist.     Pharynx: Oropharynx is clear.  Eyes:     General: No scleral icterus.       Right eye: No discharge.        Left eye: No discharge.     Extraocular Movements: Extraocular movements intact.     Conjunctiva/sclera: Conjunctivae normal.  Cardiovascular:     Rate and Rhythm: Normal rate and regular rhythm.     Heart sounds: Normal heart sounds. No murmur heard.    No friction rub. No gallop.  Pulmonary:     Effort: Pulmonary effort is normal. No respiratory distress.     Breath sounds: No stridor. No wheezing, rhonchi or rales.  Chest:     Chest wall: No tenderness.  Abdominal:     General: Bowel sounds are normal. There is no distension.     Palpations: Abdomen is soft. There is no mass.     Tenderness: There is generalized abdominal tenderness and tenderness in the epigastric area and periumbilical area. There is no right CVA tenderness, left CVA tenderness, guarding or rebound.  Skin:    General: Skin is warm and dry.  Neurological:     General: No focal deficit present.     Mental Status: She is alert and oriented to person, place, and time.  Psychiatric:        Mood and Affect: Mood normal.        Behavior: Behavior normal.        Thought Content: Thought content normal.        Judgment: Judgment normal.     US Abdomen Limited Result Date: 05/29/2023 CLINICAL DATA:  Abdominal pain EXAM: ULTRASOUND ABDOMEN LIMITED RIGHT UPPER QUADRANT COMPARISON:  None Available. FINDINGS: Gallbladder:  Gallbladder is well distended. 3 mm polyp is noted. No wall thickening or pericholecystic fluid is noted. Common bile duct: Diameter: 2.7 mm. Liver: No focal lesion identified. Within normal limits in parenchymal echogenicity. Portal vein is patent on color Doppler imaging with normal direction of blood flow towards the liver. Other: None. IMPRESSION: 3 mm gallbladder polyp. 1.  ?6 mm: no further evaluation or follow up necessary. 2. 7-9 mm:  yearly follow up with ultrasound to ensure no interval growth. 3. ?10 mm: surgical consultation for cholecystectomy, if no cholecystectomy, annual follow up is justified. Per consensus guidelines, this requires no additional evaluation or specific follow-up. This recommendation follows ACR consensus guidelines: White Paper of the ACR Incidental findings Committee II on Gallbladder and Biliary Findings. J Am Coll Radiol 2013:;10:953-956. No other focal abnormality is noted. Electronically Signed   By: Alcide Clever M.D.   On: 05/29/2023 01:15    Recent Results (from the past 2160 hours)  POCT rapid strep A     Status: None   Collection Time: 04/27/23 10:08 AM  Result Value Ref Range   Rapid Strep A Screen Negative Negative  POC Covid + Flu A/B Antigen     Status: None   Collection Time: 04/27/23 10:08 AM  Result Value Ref Range   Influenza A Antigen, POC Negative Negative   Influenza B Antigen, POC Negative Negative   Covid Antigen, POC Negative Negative  Culture, group A strep     Status: None   Collection Time: 04/27/23 10:38 AM   Specimen: Throat  Result Value Ref Range   Specimen Description THROAT    Special Requests NONE    Culture      NO GROUP A STREP (S.PYOGENES) ISOLATED Performed at [facility], [address] [place], [place] [address]    Report Status 04/30/2023 FINAL   Lipase, blood     Status: Abnormal   Collection Time: 05/28/23  6:59 PM  Result Value Ref Range   Lipase 55 (H) 11 - 51 U/L    Comment: Performed at [facility], [address] [place], [place] [address]  Comprehensive metabolic panel     Status: Abnormal   Collection Time: 05/28/23  6:59 PM  Result Value Ref Range   Sodium 137 135 - 145 mmol/L   Potassium 3.6 3.5 - 5.1 mmol/L   Chloride 108 98 - 111 mmol/L   CO2 18 (L) 22 - 32 mmol/L   Glucose, Bld 90 70 - 99 mg/dL    Comment: Glucose reference range applies only to samples taken after fasting for at least 8 hours.   BUN 11 6 - 20 mg/dL   Creatinine, Ser 0.98 0.44 - 1.00 mg/dL   Calcium 9.0 8.9 - 11.9 mg/dL   Total Protein 7.3 6.5 - 8.1 g/dL   Albumin 3.8 3.5 - 5.0 g/dL   AST 17 15 - 41 U/L   ALT 17 0 - 44 U/L   Alkaline Phosphatase 62 38 - 126 U/L   Total Bilirubin 0.6 0.0 - 1.2 mg/dL   GFR, Estimated >14 >78 mL/min    Comment: (NOTE) Calculated using the CKD-EPI Creatinine Equation (2021)    Anion gap 11 5 - 15    Comment: Performed at [facility], [address] [place], [place] [address]  CBC     Status: Abnormal   Collection Time: 05/28/23  6:59 PM  Result Value Ref Range   WBC 6.5 4.0 - 10.5 K/uL   RBC 4.55 3.87 - 5.11 MIL/uL   Hemoglobin 11.9 (L) 12.0 - 15.0 g/dL   HCT 13.0 86.5 - 78.4 %   MCV 80.0 80.0 - 100.0 fL   MCH 26.2 26.0 - 34.0 pg   MCHC 32.7 30.0 - 36.0 g/dL   RDW 69.6 29.5 - 28.4 %   Platelets 389 150 - 400 K/uL   nRBC 0.0 0.0 -  0.2 %    Comment: Performed at [facility], [address] [place], [place] [address]  Pregnancy, urine     Status: None   Collection Time: 05/29/23  8:00 AM  Result Value Ref Range   Preg Test, Ur NEGATIVE NEGATIVE    Comment:        THE SENSITIVITY OF THIS METHODOLOGY IS >25 mIU/mL. Performed at [facility], [address] [place], [place] [address]      Assessment and Plan :   PDMP not reviewed this encounter.  1. Nausea and vomiting, unspecified vomiting type   2. Side effect of medication    No signs of an acute abdomen.  Will defer repeat ER visit.  I did as the patient  follow instructions to stop using Wegovy.  Recommended supportive care including Maalox which the patient did well with before.  Use Zofran as needed.  Follow-up with PCP as soon as possible.  Maintain strict ER precautions.  Counseled patient on potential for adverse effects with medications prescribed today, patient verbalized understanding.    Wallis Bamberg, [place] 07/04/23 [phone]

## 2023-07-01 NOTE — Discharge Instructions (Addendum)
 Make sure you push fluids drinking mostly water but mix it with Gatorade.  Try to eat light meals including soups, broths and soft foods, fruits.  You may use Zofran for your nausea and vomiting once every 8 hours.  Imodium can help with diarrhea but use this carefully limiting it to 1-2 times per day only if you are having a lot of diarrhea.  Use Maalox for your upset stomach. Please return to the clinic if symptoms worsen or you start having severe abdominal pain not helped by taking Tylenol or start having bloody stools or blood in the vomit.

## 2023-07-01 NOTE — ED Triage Notes (Signed)
 Pt reports diarrhea, burping and mil abdominal since she started [facility] couple months ago. States she felt better after she had a "white meds" gives in another urgent care.

## 2023-08-06 ENCOUNTER — Ambulatory Visit
Admission: EM | Admit: 2023-08-06 | Discharge: 2023-08-06 | Disposition: A | Discharge status: Home / Self Care | Attending: Family Medicine | Admitting: Family Medicine

## 2023-08-06 DIAGNOSIS — L209 Atopic dermatitis, unspecified: Secondary | ICD-10-CM | POA: Diagnosis not present

## 2023-08-06 MED ORDER — TRIAMCINOLONE ACETONIDE 0.1 % EX CREA: 1.0000 | TOPICAL_CREAM | Freq: Two times a day (BID) | CUTANEOUS | 0 refills | Status: AC

## 2023-08-06 NOTE — ED Provider Notes (Signed)
 [facility]  Note:  This document was prepared using Conservation officer, historic buildings and may include unintentional dictation errors.  MRN: [id number] DOB: 1985-11-06  Subjective:   Robin Mcbride is a 38 y.o. female presenting for 2-week history of recurrent rash on her hands, back.  Patient has a history of eczema.  Reports a lot of itchiness to the area.  No fever, drainage of pus or bleeding.  No vesicular lesions.  No sick symptoms.  No current facility-administered medications for this encounter.  Current Outpatient Medications:    albuterol (VENTOLIN HFA) 108 (90 Base) MCG/ACT inhaler, Inhale 1-2 puffs into the lungs every 6 (six) hours as needed for wheezing or shortness of breath., Disp: 1 each, Rfl: 0   alum & mag hydroxide-simeth (MAALOX MAX) 400-400-40 MG/5ML suspension, Take 10 mLs by mouth every 6 (six) hours as needed for indigestion., Disp: 355 mL, Rfl: 0   buPROPion (WELLBUTRIN XL) 150 MG 24 hr tablet, Take by mouth., Disp: , Rfl:    diclofenac (VOLTAREN) 75 MG EC tablet, Take 1 tablet (75 mg total) by mouth 2 (two) times daily as needed (pain)., Disp: 20 tablet, Rfl: 0   docusate sodium (COLACE) 100 MG capsule, Take 1 capsule (100 mg total) by mouth every 12 (twelve) hours., Disp: 60 capsule, Rfl: 0   DULoxetine (CYMBALTA) 20 MG capsule, Take by mouth., Disp: , Rfl:    famotidine (PEPCID) 20 MG tablet, Take 1 tablet (20 mg total) by mouth 2 (two) times daily for 15 days., Disp: 30 tablet, Rfl: 0   fluticasone (FLONASE) 50 MCG/ACT nasal spray, Place 1 spray into both nostrils daily., Disp: 15.8 mL, Rfl: 0   levocetirizine (XYZAL) 5 MG tablet, Take 1 tablet (5 mg total) by mouth every evening., Disp: 90 tablet, Rfl: 0   loperamide (IMODIUM) 2 MG capsule, Take 1 capsule (2 mg total) by mouth 4 (four) times daily as needed for diarrhea or loose stools., Disp: 12 capsule, Rfl: 0   mometasone (ELOCON) 0.1 % cream, Apply 1 Application topically daily.  To affected area till better. Please cancel other steroid creams. Mometasone cream is on MCD pdl., Disp: 45 g, Rfl: 0   omeprazole (PRILOSEC) 20 MG capsule, Take 1 capsule (20 mg total) by mouth daily., Disp: 90 capsule, Rfl: 0   ondansetron (ZOFRAN) 4 MG tablet, Take one tablet (4 mg dose) by mouth every 8 (eight) hours as needed for Nausea., Disp: 30 tablet, Rfl: 0   ondansetron (ZOFRAN-ODT) 8 MG disintegrating tablet, Take 1 tablet (8 mg total) by mouth every 8 (eight) hours as needed for nausea or vomiting., Disp: 20 tablet, Rfl: 0   promethazine-dextromethorphan (PROMETHAZINE-DM) 6.25-15 MG/5ML syrup, Take 5 mLs by mouth 4 (four) times daily as needed for cough., Disp: 118 mL, Rfl: 0   sertraline (ZOLOFT) 25 MG tablet, Take 1 tablet (25 mg total) by mouth daily., Disp: 90 tablet, Rfl: 1   silver sulfADIAZINE (SILVADENE) 1 % cream, Apply 1 Application topically 2 (two) times daily., Disp: 200 g, Rfl: 0   tiZANidine (ZANAFLEX) 4 MG tablet, Take 1 tablet (4 mg total) by mouth every 8 (eight) hours as needed for muscle spasms., Disp: 15 tablet, Rfl: 0   WEGOVY 1.7 MG/0.75ML SOAJ, Inject 0.75 mLs (1.7 mg dose) into the skin once a week., Disp: 3 mL, Rfl: 0   Allergies  Allergen Reactions   Ibuprofen Hives, Itching and Other (See Comments)    brand   Influenza Vaccines Rash  If given, needs to be nasal route but monitor.   If given, needs to be nasal route but monitor.   Shellfish Allergy Rash   Wild Lettuce Extract (Lactuca Virosa) Hives    Past Medical History:  Diagnosis Date   Anemia    Asthma    Asthma    Heart murmur      Past Surgical History:  Procedure Laterality Date   BREAST SURGERY      Family History  Problem Relation Age of Onset   Asthma Mother    Hypertension Mother     Social History   Tobacco Use   Smoking status: Every Day    Current packs/day: 0.50    Types: Cigarettes   Smokeless tobacco: Never  Vaping Use   Vaping status: Some Days  Substance  Use Topics   Alcohol use: Not Currently   Drug use: Never    ROS   Objective:   Vitals: BP (P) 134/88 (BP Location: Right Arm)   Pulse (P) 84   Temp (P) 98.2 F (36.8 C) (Oral)   Resp (P) 16   LMP 08/06/2023   Physical Exam Constitutional:      General: She is not in acute distress.    Appearance: Normal appearance. She is well-developed. She is not ill-appearing, toxic-appearing or diaphoretic.  HENT:     Head: Normocephalic and atraumatic.     Nose: Nose normal.     Mouth/Throat:     Mouth: Mucous membranes are moist.  Eyes:     General: No scleral icterus.       Right eye: No discharge.        Left eye: No discharge.     Extraocular Movements: Extraocular movements intact.  Cardiovascular:     Rate and Rhythm: Normal rate.  Pulmonary:     Effort: Pulmonary effort is normal.  Skin:    General: Skin is warm and dry.       Neurological:     General: No focal deficit present.     Mental Status: She is alert and oriented to person, place, and time.  Psychiatric:        Mood and Affect: Mood normal.        Behavior: Behavior normal.     Assessment and Plan :   PDMP not reviewed this encounter.  1. Atopic dermatitis of both hands   2. Atopic dermatitis, unspecified type    Physical exam findings consistent with atopic dermatitis.  Recommended topical treatment for now.  Counseled patient on potential for adverse effects with medications prescribed/recommended today, ER and return-to-clinic precautions discussed, patient verbalized understanding.    Wallis Bamberg, [place] 08/06/23 [phone]

## 2023-08-06 NOTE — ED Triage Notes (Signed)
 Pt c/o back to rash x 2 weeks-NAD-steady gait

## 2023-08-28 ENCOUNTER — Other Ambulatory Visit: Payer: Self-pay

## 2023-08-28 ENCOUNTER — Encounter (HOSPITAL_BASED_OUTPATIENT_CLINIC_OR_DEPARTMENT_OTHER): Payer: Self-pay | Admitting: *Deleted

## 2023-08-28 ENCOUNTER — Ambulatory Visit
Admission: EM | Admit: 2023-08-28 | Discharge: 2023-08-28 | Disposition: A | Discharge status: Other Acute Inpatient Hospital | Attending: Family Medicine | Admitting: Family Medicine

## 2023-08-28 ENCOUNTER — Emergency Department (HOSPITAL_BASED_OUTPATIENT_CLINIC_OR_DEPARTMENT_OTHER)
Admission: EM | Admit: 2023-08-28 | Discharge: 2023-08-28 | Disposition: A | Discharge status: Home / Self Care | Attending: Emergency Medicine | Admitting: Emergency Medicine

## 2023-08-28 ENCOUNTER — Emergency Department (HOSPITAL_BASED_OUTPATIENT_CLINIC_OR_DEPARTMENT_OTHER)

## 2023-08-28 DIAGNOSIS — R000 Tachycardia, unspecified: Secondary | ICD-10-CM | POA: Insufficient documentation

## 2023-08-28 DIAGNOSIS — R079 Chest pain, unspecified: Secondary | ICD-10-CM | POA: Diagnosis not present

## 2023-08-28 DIAGNOSIS — R0602 Shortness of breath: Secondary | ICD-10-CM | POA: Diagnosis not present

## 2023-08-28 DIAGNOSIS — F17210 Nicotine dependence, cigarettes, uncomplicated: Secondary | ICD-10-CM | POA: Diagnosis not present

## 2023-08-28 DIAGNOSIS — R42 Dizziness and giddiness: Secondary | ICD-10-CM | POA: Insufficient documentation

## 2023-08-28 LAB — CBC
HCT: 33 % — ABNORMAL LOW (ref 36.0–46.0)
Hemoglobin: 10.6 g/dL — ABNORMAL LOW (ref 12.0–15.0)
MCH: 24.8 pg — ABNORMAL LOW (ref 26.0–34.0)
MCHC: 32.1 g/dL (ref 30.0–36.0)
MCV: 77.3 fL — ABNORMAL LOW (ref 80.0–100.0)
Platelets: 423 10*3/uL — ABNORMAL HIGH (ref 150–400)
RBC: 4.27 MIL/uL (ref 3.87–5.11)
RDW: 16.1 % — ABNORMAL HIGH (ref 11.5–15.5)
WBC: 6.4 10*3/uL (ref 4.0–10.5)
nRBC: 0 % (ref 0.0–0.2)

## 2023-08-28 LAB — D-DIMER, QUANTITATIVE: D-Dimer, Quant: 0.41 ug{FEU}/mL (ref 0.00–0.50)

## 2023-08-28 LAB — TROPONIN T, HIGH SENSITIVITY
Troponin T High Sensitivity: <15 ng/L (ref <19)
Troponin T High Sensitivity: <15 ng/L (ref <19)

## 2023-08-28 LAB — BASIC METABOLIC PANEL WITH GFR
Anion gap: 14 (ref 5–15)
BUN: 7 mg/dL (ref 6–20)
CO2: 20 mmol/L — ABNORMAL LOW (ref 22–32)
Calcium: 9.5 mg/dL (ref 8.9–10.3)
Chloride: 107 mmol/L (ref 98–111)
Creatinine, Ser: 0.68 mg/dL (ref 0.44–1.00)
GFR, Estimated: >60 mL/min (ref >60)
Glucose, Bld: 80 mg/dL (ref 70–99)
Potassium: 3.7 mmol/L (ref 3.5–5.1)
Sodium: 140 mmol/L (ref 135–145)

## 2023-08-28 LAB — PREGNANCY, URINE: Preg Test, Ur: NEGATIVE

## 2023-08-28 NOTE — Discharge Instructions (Signed)
 Please go to the ER for further evaluation of your symptoms

## 2023-08-28 NOTE — ED Provider Notes (Signed)
 [facility] Provider Note   CSN: [id number] Arrival date & time: 08/28/23  1801     History  Chief Complaint  Patient presents with   Chest Pain    Robin Mcbride is a 38 y.o. female.  Patient presents with chest pain.  She states this started 2 days ago.  At that time it started when she was at work.  She works as a Electrical engineer.  She was just sitting in a chair when it started.  She describes it as a sharp stabbing pain in the center of her chest.  It lasted about 2 hours and then eased off.  It was nonradiating.  It is worse with breathing.  She does have some shortness of breath and lightheadedness associated with it.  She felt like at times her heart was racing.  She denies any leg pain or swelling.  The pain was fairly mild yesterday and then came back again worse today although she says it is eased off now.  She says it comes and goes.  She denies any cough or cold symptoms.  No prior history of heart issues other than she does have a heart murmur.  No known history of diabetes, hypertension or hyperlipidemia.  She does smoke cigarettes.       Home Medications Prior to Admission medications   Medication Sig Start Date End Date Taking? Authorizing Provider  albuterol  (VENTOLIN  HFA) 108 (90 Base) MCG/ACT inhaler Inhale 1-2 puffs into the lungs every 6 (six) hours as needed for wheezing or shortness of breath. 04/27/23   Mayer, Jodi R, NP  alum & mag hydroxide-simeth (MAALOX MAX) 400-400-40 MG/5ML suspension Take 10 mLs by mouth every 6 (six) hours as needed for indigestion. 07/01/23   Adolph Hoop, PA-C  buPROPion (WELLBUTRIN XL) 150 MG 24 hr tablet Take by mouth. 08/23/22   [provider]  diclofenac  (VOLTAREN ) 75 MG EC tablet Take 1 tablet (75 mg total) by mouth 2 (two) times daily as needed (pain). 03/30/23   Ann Keto, MD  docusate sodium  (COLACE) 100 MG capsule Take 1 capsule (100 mg total) by mouth every 12  (twelve) hours. 07/19/22   Adolph Hoop, PA-C  DULoxetine (CYMBALTA) 20 MG capsule Take by mouth. 08/23/22   [provider]  famotidine  (PEPCID ) 20 MG tablet Take 1 tablet (20 mg total) by mouth 2 (two) times daily for 15 days. 07/19/22 04/27/23  Adolph Hoop, PA-C  fluticasone  (FLONASE ) 50 MCG/ACT nasal spray Place 1 spray into both nostrils daily. 04/27/23   Mayer, Jodi R, NP  levocetirizine (XYZAL ) 5 MG tablet Take 1 tablet (5 mg total) by mouth every evening. 12/03/21   Adolph Hoop, PA-C  loperamide  (IMODIUM ) 2 MG capsule Take 1 capsule (2 mg total) by mouth 4 (four) times daily as needed for diarrhea or loose stools. 05/29/23   Trish Furl, MD  mometasone  (ELOCON ) 0.1 % cream Apply 1 Application topically daily. To affected area till better. Please cancel other steroid creams. Mometasone  cream is on MCD pdl. 11/09/22   Ann Keto, MD  omeprazole  (PRILOSEC) 20 MG capsule Take 1 capsule (20 mg total) by mouth daily. 05/29/23   Trish Furl, MD  ondansetron  (ZOFRAN ) 4 MG tablet Take one tablet (4 mg dose) by mouth every 8 (eight) hours as needed for Nausea. 05/29/23   Trish Furl, MD  ondansetron  (ZOFRAN -ODT) 8 MG disintegrating tablet Take 1 tablet (8 mg total) by mouth every 8 (eight) hours  as needed for nausea or vomiting. 07/01/23   Adolph Hoop, PA-C  promethazine -dextromethorphan (PROMETHAZINE -DM) 6.25-15 MG/5ML syrup Take 5 mLs by mouth 4 (four) times daily as needed for cough. 04/27/23   Mayer, Jodi R, NP  sertraline  (ZOLOFT ) 25 MG tablet Take 1 tablet (25 mg total) by mouth daily. 01/12/22 07/11/22  Eloise Hake Scales, PA-C  silver  sulfADIAZINE  (SILVADENE ) 1 % cream Apply 1 Application topically 2 (two) times daily. 08/25/22   Adolph Hoop, PA-C  tiZANidine  (ZANAFLEX ) 4 MG tablet Take 1 tablet (4 mg total) by mouth every 8 (eight) hours as needed for muscle spasms. 03/30/23   Banister, Pamela K, MD  triamcinolone  cream (KENALOG ) 0.1 % Apply 1 Application topically 2 (two) times daily. 08/06/23    Adolph Hoop, PA-C  WEGOVY  1.7 MG/0.75ML SOAJ Inject 0.75 mLs (1.7 mg dose) into the skin once a week. 06/04/23         Allergies    Ibuprofen, Influenza vaccines, Shellfish allergy, and Wild lettuce extract (lactuca virosa)    Review of Systems   Review of Systems  Constitutional:  Negative for chills, diaphoresis, fatigue and fever.  HENT:  Negative for congestion, rhinorrhea and sneezing.   Eyes: Negative.   Respiratory:  Positive for shortness of breath. Negative for cough and chest tightness.   Cardiovascular:  Positive for chest pain and palpitations. Negative for leg swelling.  Gastrointestinal:  Negative for abdominal pain, blood in stool, diarrhea, nausea and vomiting.  Genitourinary:  Negative for difficulty urinating, flank pain, frequency and hematuria.  Musculoskeletal:  Negative for arthralgias and back pain.  Skin:  Negative for rash.  Neurological:  Positive for light-headedness. Negative for dizziness, speech difficulty, weakness, numbness and headaches.    Physical Exam Updated Vital Signs BP 115/64 (BP Location: Left Arm)   Pulse 88   Temp 99.1 F (37.3 C) (Oral)   Resp 17   LMP 08/06/2023   SpO2 100%  Physical Exam Constitutional:      Appearance: She is well-developed.  HENT:     Head: Normocephalic and atraumatic.  Eyes:     Pupils: Pupils are equal, round, and reactive to light.  Cardiovascular:     Rate and Rhythm: Regular rhythm. Tachycardia present.     Heart sounds: Normal heart sounds.  Pulmonary:     Effort: Pulmonary effort is normal. No respiratory distress.     Breath sounds: Normal breath sounds. No wheezing or rales.  Chest:     Chest wall: Tenderness (Some reproducible tenderness on palpation of the anterior chest wall, no crepitus or deformity) present.  Abdominal:     General: Bowel sounds are normal.     Palpations: Abdomen is soft.     Tenderness: There is no abdominal tenderness. There is no guarding or rebound.  Musculoskeletal:         General: Normal range of motion.     Cervical back: Normal range of motion and neck supple.     Comments: No edema or calf tenderness  Lymphadenopathy:     Cervical: No cervical adenopathy.  Skin:    General: Skin is warm and dry.     Findings: No rash.  Neurological:     Mental Status: She is alert and oriented to person, place, and time.     ED Results / Procedures / Treatments   Labs (all labs ordered are listed, but only abnormal results are displayed) Labs Reviewed  BASIC METABOLIC PANEL WITH GFR - Abnormal; Notable for the following components:  Result Value   CO2 20 (*)    All other components within normal limits  CBC - Abnormal; Notable for the following components:   Hemoglobin 10.6 (*)    HCT 33.0 (*)    MCV 77.3 (*)    MCH 24.8 (*)    RDW 16.1 (*)    Platelets 423 (*)    All other components within normal limits  PREGNANCY, URINE  D-DIMER, QUANTITATIVE  TROPONIN T, HIGH SENSITIVITY  TROPONIN T, HIGH SENSITIVITY    EKG None  Radiology DG Chest 2 View Result Date: 08/28/2023 CLINICAL DATA:  Chest pain for several days. EXAM: CHEST - 2 VIEW COMPARISON:  May 11, 2022. FINDINGS: The heart size and mediastinal contours are within normal limits. Both lungs are clear. The visualized skeletal structures are unremarkable. IMPRESSION: No active cardiopulmonary disease. Electronically Signed   By: Rosalene Colon M.D.   On: 08/28/2023 18:36    Procedures Procedures    Medications Ordered in ED Medications - No data to display  ED Course/ Medical Decision Making/ A&P                                 Medical Decision Making Problems Addressed: Nonspecific chest pain: undiagnosed new problem with uncertain prognosis  Amount and/or Complexity of Data Reviewed External Data Reviewed: labs. Labs: ordered. Decision-making details documented in ED Course. Radiology: ordered and independent interpretation performed. Decision-making details documented  in ED Course. ECG/medicine tests: ordered and independent interpretation performed. Decision-making details documented in ED Course.  Risk Decision regarding hospitalization.   Patient is a 38 year old who presents with chest pain.  Its sharp in nature and somewhat reproducible on palpation of the chest wall.  Her EKG does not show any ischemic changes.  There is a short PR interval but it appears to be sinus in nature.  She has had 2 negative troponins.  Chest x-ray was performed which was interpreted by me and confirmed by the radiologist to show no evidence of pneumonia or pulmonary edema.  Her D-dimer is negative and she does not have other symptoms that would be more concerning for PE.  No symptoms that sound more concerning for aortic dissection.  She has minimal discomfort currently.  Her other labs reviewed are nonconcerning.  She was discharged home in good condition.  She was given a referral to follow-up with cardiology.  Return precautions were given.  {Final Clinical Impression(s) / ED Diagnoses Final diagnoses:  Nonspecific chest pain    Rx / DC Orders ED Discharge Orders     None         Hershel Los, MD 08/28/23 2144

## 2023-08-28 NOTE — ED Notes (Signed)
 Patient is being discharged from the Urgent Care and sent to the Emergency Department via POV . Per Ramonita Burow, NP, patient is in need of higher level of care due to needing higher level of care. Patient is aware and verbalizes understanding of plan of care.  Vitals:   08/28/23 1701  BP: 119/80  Pulse: (!) 115  Resp: 17  Temp: 98.6 F (37 C)  SpO2: 97%

## 2023-08-28 NOTE — ED Triage Notes (Signed)
 Pt c/o non radiating left sided chest painx2d. Pt states when she inhales deeply the chest pain hurts worse. Pt c/o SOB. Pt denies N/V. Pt is eupneic at present

## 2023-08-28 NOTE — ED Triage Notes (Signed)
 Pt was seen at [facility] due to CP which she has had since Sunday, she was told to come here for further evaluation after they did EKG (something was seen on EKG).  Pain has been stabbing and in central chest.  Pain is intermittent and is associated with sob and dizziness.

## 2023-08-28 NOTE — ED Provider Notes (Signed)
 [facility]    CSN: [id number] Arrival date & time: 08/28/23  1646      History   Chief Complaint No chief complaint on file.   HPI Robin Mcbride is a 38 y.o. female presents for chest pain.  Patient reports 2 days of a initially persistent now intermittent left-sided chest pain that she describes as a pressure that does not radiate.  States is associated with shortness of breath, dizziness, and near syncope.  Denies any nausea/vomiting, palpitations, or syncope.  She does have a past medical history of asthma, anemia, and heart murmur.  She states the symptoms are not consistent with her typical asthma symptoms and she denies any URI symptoms.  She did try to use her albuterol  inhaler with no change.  She states her symptoms are better with rest and worse with activity.  She does smoke tobacco.  Denies any oral or birth control implant.  Reports a family history of stroke with her dad and heart disease with her mother.  In addition she reports she had both an endoscopy and a colonoscopy 2 days prior to onset of her symptoms.  She has not taken any OTC medications for symptoms.  No other concerns at this time.  HPI  Past Medical History:  Diagnosis Date   Anemia    Asthma    Asthma    Heart murmur     There are no active problems to display for this patient.   Past Surgical History:  Procedure Laterality Date   BREAST SURGERY     UPPER GI ENDOSCOPY      OB History   No obstetric history on file.      Home Medications    Prior to Admission medications   Medication Sig Start Date End Date Taking? Authorizing Provider  albuterol  (VENTOLIN  HFA) 108 (90 Base) MCG/ACT inhaler Inhale 1-2 puffs into the lungs every 6 (six) hours as needed for wheezing or shortness of breath. 04/27/23   Sabre Romberger, Jodi R, NP  alum & mag hydroxide-simeth (MAALOX MAX) 400-400-40 MG/5ML suspension Take 10 mLs by mouth every 6 (six) hours as needed for indigestion. 07/01/23   Adolph Hoop,  PA-C  buPROPion (WELLBUTRIN XL) 150 MG 24 hr tablet Take by mouth. 08/23/22   [provider]  diclofenac  (VOLTAREN ) 75 MG EC tablet Take 1 tablet (75 mg total) by mouth 2 (two) times daily as needed (pain). 03/30/23   Ann Keto, MD  docusate sodium  (COLACE) 100 MG capsule Take 1 capsule (100 mg total) by mouth every 12 (twelve) hours. 07/19/22   Adolph Hoop, PA-C  DULoxetine (CYMBALTA) 20 MG capsule Take by mouth. 08/23/22   [provider]  famotidine  (PEPCID ) 20 MG tablet Take 1 tablet (20 mg total) by mouth 2 (two) times daily for 15 days. 07/19/22 04/27/23  Adolph Hoop, PA-C  fluticasone  (FLONASE ) 50 MCG/ACT nasal spray Place 1 spray into both nostrils daily. 04/27/23   Kamden Reber, Jodi R, NP  levocetirizine (XYZAL ) 5 MG tablet Take 1 tablet (5 mg total) by mouth every evening. 12/03/21   Adolph Hoop, PA-C  loperamide  (IMODIUM ) 2 MG capsule Take 1 capsule (2 mg total) by mouth 4 (four) times daily as needed for diarrhea or loose stools. 05/29/23   Trish Furl, MD  mometasone  (ELOCON ) 0.1 % cream Apply 1 Application topically daily. To affected area till better. Please cancel other steroid creams. Mometasone  cream is on MCD pdl. 11/09/22   Ellsworth Haas Paige Boatman, MD  omeprazole  [facility])  20 MG capsule Take 1 capsule (20 mg total) by mouth daily. 05/29/23   Trish Furl, MD  ondansetron  (ZOFRAN ) 4 MG tablet Take one tablet (4 mg dose) by mouth every 8 (eight) hours as needed for Nausea. 05/29/23   Trish Furl, MD  ondansetron  (ZOFRAN -ODT) 8 MG disintegrating tablet Take 1 tablet (8 mg total) by mouth every 8 (eight) hours as needed for nausea or vomiting. 07/01/23   Adolph Hoop, PA-C  promethazine -dextromethorphan (PROMETHAZINE -DM) 6.25-15 MG/5ML syrup Take 5 mLs by mouth 4 (four) times daily as needed for cough. 04/27/23   Malloree Raboin, Jodi R, NP  sertraline  (ZOLOFT ) 25 MG tablet Take 1 tablet (25 mg total) by mouth daily. 01/12/22 07/11/22  Eloise Hake Scales, PA-C  silver  sulfADIAZINE  (SILVADENE )  1 % cream Apply 1 Application topically 2 (two) times daily. 08/25/22   Adolph Hoop, PA-C  tiZANidine  (ZANAFLEX ) 4 MG tablet Take 1 tablet (4 mg total) by mouth every 8 (eight) hours as needed for muscle spasms. 03/30/23   Banister, Pamela K, MD  triamcinolone  cream (KENALOG ) 0.1 % Apply 1 Application topically 2 (two) times daily. 08/06/23   Adolph Hoop, PA-C  WEGOVY  1.7 MG/0.75ML SOAJ Inject 0.75 mLs (1.7 mg dose) into the skin once a week. 06/04/23       Family History Family History  Problem Relation Age of Onset   Asthma Mother    Hypertension Mother     Social History Social History   Tobacco Use   Smoking status: Every Day    Current packs/day: 0.50    Types: Cigarettes   Smokeless tobacco: Never  Vaping Use   Vaping status: Some Days  Substance Use Topics   Alcohol use: Not Currently   Drug use: Never     Allergies   Ibuprofen, Influenza vaccines, Shellfish allergy, and Wild lettuce extract (lactuca virosa)   Review of Systems Review of Systems  Respiratory:  Positive for shortness of breath.   Cardiovascular:  Positive for chest pain.  Neurological:  Positive for dizziness.       Near syncope     Physical Exam Triage Vital Signs ED Triage Vitals  Encounter Vitals Group     BP 08/28/23 1701 119/80     Systolic BP Percentile --      Diastolic BP Percentile --      Pulse Rate 08/28/23 1701 (!) 115     Resp 08/28/23 1701 17     Temp 08/28/23 1701 98.6 F (37 C)     Temp Source 08/28/23 1701 Oral     SpO2 08/28/23 1701 97 %     Weight --      Height --      Head Circumference --      Peak Flow --      Pain Score 08/28/23 1656 8     Pain Loc --      Pain Education --      Exclude from Growth Chart --    No data found.  Updated Vital Signs BP 119/80   Pulse (!) 115   Temp 98.6 F (37 C) (Oral)   Resp 17   LMP 08/06/2023   SpO2 97%   Visual Acuity Right Eye Distance:   Left Eye Distance:   Bilateral Distance:    Right Eye Near:   Left Eye  Near:    Bilateral Near:     Physical Exam Vitals and nursing note reviewed.  Constitutional:      General: She is not in  acute distress.    Appearance: Normal appearance. She is not ill-appearing.  HENT:     Head: Normocephalic and atraumatic.  Eyes:     Pupils: Pupils are equal, round, and reactive to light.  Cardiovascular:     Rate and Rhythm: Regular rhythm. Tachycardia present.     Heart sounds: Normal heart sounds.     Comments: Heart rate 115 Pulmonary:     Effort: Pulmonary effort is normal. No respiratory distress.     Breath sounds: Normal breath sounds. No stridor. No wheezing, rhonchi or rales.  Chest:     Chest wall: Tenderness present.  Skin:    General: Skin is warm and dry.  Neurological:     General: No focal deficit present.     Mental Status: She is alert and oriented to person, place, and time.  Psychiatric:        Mood and Affect: Mood normal.        Behavior: Behavior normal.    PERC Criteria for low probability for Pulmonary Embolism  Age <50 years  Heart rate <100 beats/minute  Oxyhemoglobin saturation >=95 percent  No hemoptysis  No estrogen use  No prior DVT or PE  No unilateral leg swelling  No surgery/trauma requiring hospitalization within the prior four weeks  Total score: 1    UC Treatments / Results  Labs (all labs ordered are listed, but only abnormal results are displayed) Labs Reviewed - No data to display  EKG   Radiology No results found.  Procedures ED EKG  Date/Time: 08/28/2023 5:28 PM  Performed by: Alleen Arbour, NP Authorized by: Alleen Arbour, NP   ECG interpreted by ED Physician in the absence of a cardiologist: no   Previous ECG:    Previous ECG:  Compared to current Rate:    ECG rate:  99   ECG rate assessment: normal   Rhythm:    Rhythm: sinus rhythm   QRS:    QRS axis:  Normal ST segments:    ST segments:  Non-specific T waves:    T waves: non-specific    (including critical care  time)  Medications Ordered in UC Medications - No data to display  Initial Impression / Assessment and Plan / UC Course  I have reviewed the triage vital signs and the nursing notes.  Pertinent labs & imaging results that were available during my care of the patient were reviewed by me and considered in my medical decision making (see chart for details).     I reviewed exam and symptoms with patient.  Discussed limitations and abilities of urgent care.  Patient presenting with 2 days of left-sided chest pain.  Did recently have endoscopy.  EKG is abnormal.  Risk factors include obesity and smoking.  Discussed multiple causes of chest pain including cardiac pulmonary, musculoskeletal, etc.  Advised I cannot rule out any life-threatening causes of her symptoms in the setting advised that she go to the ER for further workup.  She is in agreement with plan.  She declined EMS transfer stating she will go POV.  Informed of risk going POV including heart attack, stroke, permanent disability, and/or death.  I instructed that she pull over and call 911 for any worsening symptoms that occur in transit and she verbalized understanding. Final Clinical Impressions(s) / UC Diagnoses   Final diagnoses:  Chest pain, unspecified type   Discharge Instructions   None    ED Prescriptions   None    PDMP not  reviewed this encounter.   Alleen Arbour, NP 08/28/23 1731

## 2023-08-29 ENCOUNTER — Other Ambulatory Visit: Payer: Self-pay

## 2023-08-29 ENCOUNTER — Encounter (HOSPITAL_BASED_OUTPATIENT_CLINIC_OR_DEPARTMENT_OTHER): Payer: Self-pay | Admitting: Emergency Medicine

## 2023-08-29 ENCOUNTER — Emergency Department (HOSPITAL_BASED_OUTPATIENT_CLINIC_OR_DEPARTMENT_OTHER)
Admission: EM | Admit: 2023-08-29 | Discharge: 2023-08-29 | Disposition: A | Discharge status: Home / Self Care | Attending: Emergency Medicine | Admitting: Emergency Medicine

## 2023-08-29 DIAGNOSIS — Z79899 Other long term (current) drug therapy: Secondary | ICD-10-CM | POA: Insufficient documentation

## 2023-08-29 DIAGNOSIS — K645 Perianal venous thrombosis: Secondary | ICD-10-CM

## 2023-08-29 DIAGNOSIS — K644 Residual hemorrhoidal skin tags: Secondary | ICD-10-CM | POA: Insufficient documentation

## 2023-08-29 MED ORDER — LIDOCAINE 5 % EX OINT: 1.0000 | TOPICAL_OINTMENT | Freq: Two times a day (BID) | CUTANEOUS | 0 refills | Status: AC | PRN

## 2023-08-29 NOTE — ED Triage Notes (Signed)
 Pt reports she has a hemorrhoid that is preventing her from having a bowel movement because "it is in the way"  Pt states it is hard tissue protruding.  Some blood with wiping.

## 2023-08-29 NOTE — ED Provider Notes (Signed)
 [place] [facility] Provider Note   CSN: [id number] Arrival date & time: 08/29/23  1801     History  Chief Complaint  Patient presents with   Hemorrhoids    Robin Mcbride is a 38 y.o. female who presents to the emergency department with a complaint of suspected hemorrhoids. The patient states that last Thursday she had a colonoscopy and that in the days following she developed a bulge around her rectum. She states that the bulge is painful when sitting on the area and is painful when attempting to defecate. Patient denies being able to defecate at this time, with her last BM being reported as Tuesday (4/29). Patient denies fever, chills, excessive blood in stool. Patient denies ever having this sensation or feeling before, and has never been diagnosed with hemorrhoids. Does state that there has been a minimal amount of blood present on toilet paper when wiping.    HPI     Home Medications Prior to Admission medications   Medication Sig Start Date End Date Taking? Authorizing Provider  albuterol  (VENTOLIN  HFA) 108 (90 Base) MCG/ACT inhaler Inhale 1-2 puffs into the lungs every 6 (six) hours as needed for wheezing or shortness of breath. 04/27/23   Mayer, Jodi R, NP  alum & mag hydroxide-simeth (MAALOX MAX) 400-400-40 MG/5ML suspension Take 10 mLs by mouth every 6 (six) hours as needed for indigestion. 07/01/23   Adolph Hoop, PA-C  buPROPion (WELLBUTRIN XL) 150 MG 24 hr tablet Take by mouth. 08/23/22   [provider]  diclofenac  (VOLTAREN ) 75 MG EC tablet Take 1 tablet (75 mg total) by mouth 2 (two) times daily as needed (pain). 03/30/23   Ann Keto, MD  docusate sodium  (COLACE) 100 MG capsule Take 1 capsule (100 mg total) by mouth every 12 (twelve) hours. 07/19/22   Adolph Hoop, PA-C  DULoxetine (CYMBALTA) 20 MG capsule Take by mouth. 08/23/22   [provider]  famotidine  (PEPCID ) 20 MG tablet Take 1 tablet (20 mg total)  by mouth 2 (two) times daily for 15 days. 07/19/22 04/27/23  Adolph Hoop, PA-C  fluticasone  (FLONASE ) 50 MCG/ACT nasal spray Place 1 spray into both nostrils daily. 04/27/23   Mayer, Jodi R, NP  levocetirizine (XYZAL ) 5 MG tablet Take 1 tablet (5 mg total) by mouth every evening. 12/03/21   Adolph Hoop, PA-C  loperamide  (IMODIUM ) 2 MG capsule Take 1 capsule (2 mg total) by mouth 4 (four) times daily as needed for diarrhea or loose stools. 05/29/23   Trish Furl, MD  mometasone  (ELOCON ) 0.1 % cream Apply 1 Application topically daily. To affected area till better. Please cancel other steroid creams. Mometasone  cream is on MCD pdl. 11/09/22   Ann Keto, MD  omeprazole  (PRILOSEC) 20 MG capsule Take 1 capsule (20 mg total) by mouth daily. 05/29/23   Trish Furl, MD  ondansetron  (ZOFRAN ) 4 MG tablet Take one tablet (4 mg dose) by mouth every 8 (eight) hours as needed for Nausea. 05/29/23   Trish Furl, MD  ondansetron  (ZOFRAN -ODT) 8 MG disintegrating tablet Take 1 tablet (8 mg total) by mouth every 8 (eight) hours as needed for nausea or vomiting. 07/01/23   Adolph Hoop, PA-C  promethazine -dextromethorphan (PROMETHAZINE -DM) 6.25-15 MG/5ML syrup Take 5 mLs by mouth 4 (four) times daily as needed for cough. 04/27/23   Mayer, Jodi R, NP  sertraline  (ZOLOFT ) 25 MG tablet Take 1 tablet (25 mg total) by mouth daily. 01/12/22 07/11/22  Eloise Hake Scales, PA-C  silver  sulfADIAZINE  (SILVADENE ) 1 % cream Apply 1 Application topically 2 (two) times daily. 08/25/22   Adolph Hoop, PA-C  tiZANidine  (ZANAFLEX ) 4 MG tablet Take 1 tablet (4 mg total) by mouth every 8 (eight) hours as needed for muscle spasms. 03/30/23   Banister, Pamela K, MD  triamcinolone  cream (KENALOG ) 0.1 % Apply 1 Application topically 2 (two) times daily. 08/06/23   Adolph Hoop, PA-C  WEGOVY  1.7 MG/0.75ML SOAJ Inject 0.75 mLs (1.7 mg dose) into the skin once a week. 06/04/23         Allergies    Ibuprofen, Influenza vaccines, Shellfish allergy, and  Wild lettuce extract (lactuca virosa)    Review of Systems   Review of Systems  Constitutional:  Negative for chills, fatigue and fever.  Respiratory:  Negative for chest tightness and shortness of breath.   Cardiovascular:  Negative for chest pain.  Gastrointestinal:  Positive for anal bleeding and rectal pain. Negative for abdominal pain, blood in stool, diarrhea, nausea and vomiting.  Genitourinary:  Negative for difficulty urinating, pelvic pain, vaginal bleeding, vaginal discharge and vaginal pain.  Skin:  Negative for rash and wound.  Neurological:  Negative for dizziness and weakness.    Physical Exam Updated Vital Signs BP 128/89   Pulse 77   Temp 98.4 F (36.9 C) (Oral)   Resp 17   LMP 08/06/2023   SpO2 99%  Physical Exam Vitals and nursing note reviewed.  Constitutional:      General: She is awake. She is not in acute distress.    Appearance: Normal appearance. She is not ill-appearing, toxic-appearing or diaphoretic.  HENT:     Head: Normocephalic and atraumatic.  Pulmonary:     Effort: Pulmonary effort is normal.  Abdominal:     General: Abdomen is flat.     Palpations: Abdomen is soft.  Genitourinary:      Comments: Patient has a thrombosed external hemorrhoid present just outside the rectum at [facility] clock, the hemorrhoid is not blocking the rectum at time of exam  Skin:    General: Skin is warm and dry.     Capillary Refill: Capillary refill takes less than 2 seconds.  Neurological:     General: No focal deficit present.     Mental Status: She is alert and oriented to person, place, and time.     Motor: No weakness.  Psychiatric:        Mood and Affect: Mood normal.        Behavior: Behavior normal. Behavior is cooperative.     ED Results / Procedures / Treatments   Labs (all labs ordered are listed, but only abnormal results are displayed) Labs Reviewed - No data to display  EKG None  Radiology DG Chest 2 View Result Date: 08/28/2023 CLINICAL  DATA:  Chest pain for several days. EXAM: CHEST - 2 VIEW COMPARISON:  May 11, 2022. FINDINGS: The heart size and mediastinal contours are within normal limits. Both lungs are clear. The visualized skeletal structures are unremarkable. IMPRESSION: No active cardiopulmonary disease. Electronically Signed   By: Rosalene Colon M.D.   On: 08/28/2023 18:36    Procedures Procedures    Medications Ordered in ED Medications - No data to display  ED Course/ Medical Decision Making/ A&P      Patient presents to the ED for concern of external thrombosed hemorrhoid, this involves an extensive number of treatment options, and is a complaint that carries with it a high risk of complications and morbidity.  The differential diagnosis includes external thrombosed hemorrhoid, anal fissure, prolapse, perianal abscess, anal fistula, anal cancer, etc.    Co morbidities that complicate the patient evaluation  none   Medicines ordered and prescription drug management:  I ordered medication including outpatient lidocaine  ointment for pain from thrombosed external hemorrhoid Reevaluation of the patient after these medicines showed that the patient stayed the same I have reviewed the patients home medicines and have made adjustments as needed   Test Considered:  CBC - declined due to patient reporting no fever, chills, or other symptoms besides rectal pain that is worse when sitting and attempting to defecate Imaging - declined due to physical exam finding showing thrombosed external hemorrhoid    Problem List / ED Course:  Thrombosed external hemorrhoid   Reevaluation:  After the interventions noted above, I reevaluated the patient and found that they have :stayed the same   Social Determinants of Health:  none   Dispostion:  After consideration of the diagnostic results and the patients response to treatment, I feel that the patent would benefit from discharge with prescription  lidocaine  cream for thrombosed external hemorrhoid. Patient also educated on over the counter remedies for hemorrhoid pain relief. Referral placed today to [facility] [place] [facility] if condition worsens or symptoms persist. Patient instructed to return to the ED or seek medical care if condition worsens including but not limited to severe constipation, excessive blood in stool, drainage from rectum, fever, chills, etc.   Click here for ABCD2, HEART and other calculatorsREFRESH Note before signing :1}                              Medical Decision Making          Final Clinical Impression(s) / ED Diagnoses Final diagnoses:  None    Rx / DC Orders ED Discharge Orders     None         Susanne Epps 08/29/23 2052    Almond Army, MD 08/31/23 1907

## 2023-08-29 NOTE — Discharge Instructions (Addendum)
 It was a pleasure taking care of you today. I have attached some information about external hemorrhoids and home care for them. Some of these options include sitz baths, hemorrhoid creams, high fiber diet, preparation H suppositories/ointment, stool softeners (Miralax or Colace), etc. I have also gone ahead and placed a surgical referral for you if you are not finding relief from home remedies and prescribed medication.

## 2023-09-18 ENCOUNTER — Ambulatory Visit
Admission: EM | Admit: 2023-09-18 | Discharge: 2023-09-18 | Disposition: A | Discharge status: Home / Self Care | Attending: Family Medicine | Admitting: Family Medicine

## 2023-09-18 DIAGNOSIS — L209 Atopic dermatitis, unspecified: Secondary | ICD-10-CM | POA: Diagnosis not present

## 2023-09-18 DIAGNOSIS — L301 Dyshidrosis [pompholyx]: Secondary | ICD-10-CM

## 2023-09-18 MED ORDER — PREDNISONE 20 MG PO TABS: ORAL_TABLET | ORAL | 0 refills | Status: DC

## 2023-09-18 NOTE — ED Provider Notes (Signed)
 [facility]  Note:  This document was prepared using Conservation officer, historic buildings and may include unintentional dictation errors.  MRN: [id number] DOB: 1985-05-19  Subjective:   Robin Mcbride is a 38 y.o. female presenting for acute on chronic rash of the palms of her hands and fingers.  Patient reports longstanding history of the same.  Reports that she has a more difficult time with topical steroids and prefers systemic steroids.  She has responded best to steroid injection but prefers to avoid this for now.  No fever, drainage of pus or bleeding.  Has never seen a dermatologist.  No current facility-administered medications for this encounter.  Current Outpatient Medications:    albuterol  (VENTOLIN  HFA) 108 (90 Base) MCG/ACT inhaler, Inhale 1-2 puffs into the lungs every 6 (six) hours as needed for wheezing or shortness of breath., Disp: 1 each, Rfl: 0   alum & mag hydroxide-simeth (MAALOX MAX) 400-400-40 MG/5ML suspension, Take 10 mLs by mouth every 6 (six) hours as needed for indigestion., Disp: 355 mL, Rfl: 0   buPROPion (WELLBUTRIN XL) 150 MG 24 hr tablet, Take by mouth., Disp: , Rfl:    diclofenac  (VOLTAREN ) 75 MG EC tablet, Take 1 tablet (75 mg total) by mouth 2 (two) times daily as needed (pain)., Disp: 20 tablet, Rfl: 0   docusate sodium  (COLACE) 100 MG capsule, Take 1 capsule (100 mg total) by mouth every 12 (twelve) hours., Disp: 60 capsule, Rfl: 0   DULoxetine (CYMBALTA) 20 MG capsule, Take by mouth., Disp: , Rfl:    famotidine  (PEPCID ) 20 MG tablet, Take 1 tablet (20 mg total) by mouth 2 (two) times daily for 15 days., Disp: 30 tablet, Rfl: 0   fluticasone  (FLONASE ) 50 MCG/ACT nasal spray, Place 1 spray into both nostrils daily., Disp: 15.8 mL, Rfl: 0   levocetirizine (XYZAL ) 5 MG tablet, Take 1 tablet (5 mg total) by mouth every evening., Disp: 90 tablet, Rfl: 0   lidocaine  (XYLOCAINE ) 5 % ointment, Apply 1 Application topically 2 (two) times  daily as needed for mild pain (pain score 1-3) or moderate pain (pain score 4-6) (Apply this cream to your external hemorhoid)., Disp: 35.44 g, Rfl: 0   loperamide  (IMODIUM ) 2 MG capsule, Take 1 capsule (2 mg total) by mouth 4 (four) times daily as needed for diarrhea or loose stools., Disp: 12 capsule, Rfl: 0   mometasone  (ELOCON ) 0.1 % cream, Apply 1 Application topically daily. To affected area till better. Please cancel other steroid creams. Mometasone  cream is on MCD pdl., Disp: 45 g, Rfl: 0   omeprazole  (PRILOSEC) 20 MG capsule, Take 1 capsule (20 mg total) by mouth daily., Disp: 90 capsule, Rfl: 0   ondansetron  (ZOFRAN ) 4 MG tablet, Take one tablet (4 mg dose) by mouth every 8 (eight) hours as needed for Nausea., Disp: 30 tablet, Rfl: 0   ondansetron  (ZOFRAN -ODT) 8 MG disintegrating tablet, Take 1 tablet (8 mg total) by mouth every 8 (eight) hours as needed for nausea or vomiting., Disp: 20 tablet, Rfl: 0   promethazine -dextromethorphan (PROMETHAZINE -DM) 6.25-15 MG/5ML syrup, Take 5 mLs by mouth 4 (four) times daily as needed for cough., Disp: 118 mL, Rfl: 0   sertraline  (ZOLOFT ) 25 MG tablet, Take 1 tablet (25 mg total) by mouth daily., Disp: 90 tablet, Rfl: 1   silver  sulfADIAZINE  (SILVADENE ) 1 % cream, Apply 1 Application topically 2 (two) times daily., Disp: 200 g, Rfl: 0   tiZANidine  (ZANAFLEX ) 4 MG tablet, Take 1 tablet (4  mg total) by mouth every 8 (eight) hours as needed for muscle spasms., Disp: 15 tablet, Rfl: 0   triamcinolone  cream (KENALOG ) 0.1 %, Apply 1 Application topically 2 (two) times daily., Disp: 80 g, Rfl: 0   WEGOVY  1.7 MG/0.75ML SOAJ, Inject 0.75 mLs (1.7 mg dose) into the skin once a week., Disp: 3 mL, Rfl: 0   Allergies  Allergen Reactions   Ibuprofen Hives, Itching and Other (See Comments)    brand   Influenza Vaccines Rash    If given, needs to be nasal route but monitor.   If given, needs to be nasal route but monitor.   Shellfish Allergy Rash   Wild Lettuce  Extract (Lactuca Virosa) Hives    Past Medical History:  Diagnosis Date   Anemia    Asthma    Asthma    Heart murmur      Past Surgical History:  Procedure Laterality Date   BREAST SURGERY     UPPER GI ENDOSCOPY      Family History  Problem Relation Age of Onset   Asthma Mother    Hypertension Mother     Social History   Tobacco Use   Smoking status: Every Day    Current packs/day: 0.50    Types: Cigarettes   Smokeless tobacco: Never  Vaping Use   Vaping status: Never Used  Substance Use Topics   Alcohol use: Not Currently   Drug use: Never    ROS   Objective:   Vitals: Pulse (P) 68   Temp (P) 97.7 F (36.5 C) (Oral)   Resp (P) 16   LMP 09/03/2023   SpO2 (P) 97%   Physical Exam Constitutional:      General: She is not in acute distress.    Appearance: Normal appearance. She is well-developed. She is not ill-appearing, toxic-appearing or diaphoretic.  HENT:     Head: Normocephalic and atraumatic.     Nose: Nose normal.     Mouth/Throat:     Mouth: Mucous membranes are moist.  Eyes:     General: No scleral icterus.       Right eye: No discharge.        Left eye: No discharge.     Extraocular Movements: Extraocular movements intact.  Cardiovascular:     Rate and Rhythm: Normal rate.  Pulmonary:     Effort: Pulmonary effort is normal.  Skin:    General: Skin is warm and dry.     Findings: Rash (Dry scaly plaque-like lesions over the palmar aspect of the hands; patches of vesicular lesions over the dorsal aspect of the 2nd through 3rd fingers of the hands bilaterally) present.  Neurological:     General: No focal deficit present.     Mental Status: She is alert and oriented to person, place, and time.  Psychiatric:        Mood and Affect: Mood normal.        Behavior: Behavior normal.          Assessment and Plan :   PDMP not reviewed this encounter.  1. Atopic dermatitis of both hands   2. Dyshidrotic eczema    Physical exam  findings most consistent with dyshidrotic eczema and atopic dermatitis of both hands.  Recommended an oral prednisone  course as she has failed treatment with topical steroid.  Referral placed to dermatology.  Counseled patient on potential for adverse effects with medications prescribed/recommended today, ER and return-to-clinic precautions discussed, patient verbalized understanding.  Adolph Hoop, PA-C 09/18/23 1352

## 2023-09-18 NOTE — ED Triage Notes (Signed)
 Pt c/o rash to palms of hands "for years"-states she has been seen multiple times for same with rx "creams"-NAD-steady gait

## 2023-11-04 ENCOUNTER — Ambulatory Visit
Admission: EM | Admit: 2023-11-04 | Discharge: 2023-11-04 | Disposition: A | Discharge status: Home / Self Care | Attending: Family Medicine | Admitting: Family Medicine

## 2023-11-04 DIAGNOSIS — M546 Pain in thoracic spine: Secondary | ICD-10-CM | POA: Diagnosis not present

## 2023-11-04 DIAGNOSIS — G8929 Other chronic pain: Secondary | ICD-10-CM

## 2023-11-04 MED ORDER — PREDNISONE 20 MG PO TABS: ORAL_TABLET | ORAL | 0 refills | Status: AC

## 2023-11-04 MED ORDER — CYCLOBENZAPRINE HCL 5 MG PO TABS: 5.0000 mg | ORAL_TABLET | Freq: Every evening | ORAL | 0 refills | Status: AC | PRN

## 2023-11-04 NOTE — ED Provider Notes (Signed)
 [facility]  Note:  This document was prepared using Conservation officer, historic buildings and may include unintentional dictation errors.  MRN: [id number] DOB: 05-07-1985  Subjective:   Robin Mcbride is a 38 y.o. female presenting for 5 day history of acute on chronic mid back pain.  She has had imaging done repeatedly throughout this timeframe.  She last went to the spine clinic and had been advised that she had a bulging disc.  She is supposed to undergo physical therapy but they never managed to schedule this for her.  Currently, patient just got switched into position at work that is aggravating her back pain.  No fall, trauma, weakness, numbness or tingling, radicular symptoms.  No current facility-administered medications for this encounter.  Current Outpatient Medications:    albuterol  (VENTOLIN  HFA) 108 (90 Base) MCG/ACT inhaler, Inhale 1-2 puffs into the lungs every 6 (six) hours as needed for wheezing or shortness of breath., Disp: 1 each, Rfl: 0   alum & mag hydroxide-simeth (MAALOX MAX) 400-400-40 MG/5ML suspension, Take 10 mLs by mouth every 6 (six) hours as needed for indigestion., Disp: 355 mL, Rfl: 0   buPROPion (WELLBUTRIN XL) 150 MG 24 hr tablet, Take by mouth., Disp: , Rfl:    diclofenac  (VOLTAREN ) 75 MG EC tablet, Take 1 tablet (75 mg total) by mouth 2 (two) times daily as needed (pain)., Disp: 20 tablet, Rfl: 0   docusate sodium  (COLACE) 100 MG capsule, Take 1 capsule (100 mg total) by mouth every 12 (twelve) hours., Disp: 60 capsule, Rfl: 0   DULoxetine (CYMBALTA) 20 MG capsule, Take by mouth., Disp: , Rfl:    famotidine  (PEPCID ) 20 MG tablet, Take 1 tablet (20 mg total) by mouth 2 (two) times daily for 15 days., Disp: 30 tablet, Rfl: 0   fluticasone  (FLONASE ) 50 MCG/ACT nasal spray, Place 1 spray into both nostrils daily., Disp: 15.8 mL, Rfl: 0   levocetirizine (XYZAL ) 5 MG tablet, Take 1 tablet (5 mg total) by mouth every evening., Disp: 90  tablet, Rfl: 0   lidocaine  (XYLOCAINE ) 5 % ointment, Apply 1 Application topically 2 (two) times daily as needed for mild pain (pain score 1-3) or moderate pain (pain score 4-6) (Apply this cream to your external hemorhoid)., Disp: 35.44 g, Rfl: 0   loperamide  (IMODIUM ) 2 MG capsule, Take 1 capsule (2 mg total) by mouth 4 (four) times daily as needed for diarrhea or loose stools., Disp: 12 capsule, Rfl: 0   mometasone  (ELOCON ) 0.1 % cream, Apply 1 Application topically daily. To affected area till better. Please cancel other steroid creams. Mometasone  cream is on MCD pdl., Disp: 45 g, Rfl: 0   omeprazole  (PRILOSEC) 20 MG capsule, Take 1 capsule (20 mg total) by mouth daily., Disp: 90 capsule, Rfl: 0   ondansetron  (ZOFRAN ) 4 MG tablet, Take one tablet (4 mg dose) by mouth every 8 (eight) hours as needed for Nausea., Disp: 30 tablet, Rfl: 0   ondansetron  (ZOFRAN -ODT) 8 MG disintegrating tablet, Take 1 tablet (8 mg total) by mouth every 8 (eight) hours as needed for nausea or vomiting., Disp: 20 tablet, Rfl: 0   predniSONE  (DELTASONE ) 20 MG tablet, Take 2 tablets daily with breakfast., Disp: 10 tablet, Rfl: 0   promethazine -dextromethorphan (PROMETHAZINE -DM) 6.25-15 MG/5ML syrup, Take 5 mLs by mouth 4 (four) times daily as needed for cough., Disp: 118 mL, Rfl: 0   sertraline  (ZOLOFT ) 25 MG tablet, Take 1 tablet (25 mg total) by mouth daily., Disp: 90 tablet,  Rfl: 1   silver  sulfADIAZINE  (SILVADENE ) 1 % cream, Apply 1 Application topically 2 (two) times daily., Disp: 200 g, Rfl: 0   tiZANidine  (ZANAFLEX ) 4 MG tablet, Take 1 tablet (4 mg total) by mouth every 8 (eight) hours as needed for muscle spasms., Disp: 15 tablet, Rfl: 0   triamcinolone  cream (KENALOG ) 0.1 %, Apply 1 Application topically 2 (two) times daily., Disp: 80 g, Rfl: 0   WEGOVY  1.7 MG/0.75ML SOAJ, Inject 0.75 mLs (1.7 mg dose) into the skin once a week., Disp: 3 mL, Rfl: 0   Allergies  Allergen Reactions   Ibuprofen Hives, Itching and  Other (See Comments)    brand   Influenza Vaccines Rash    If given, needs to be nasal route but monitor.   If given, needs to be nasal route but monitor.   Shellfish Allergy Rash   Wild Lettuce Extract (Lactuca Virosa) Hives    Past Medical History:  Diagnosis Date   Anemia    Asthma    Asthma    Heart murmur      Past Surgical History:  Procedure Laterality Date   BREAST SURGERY     UPPER GI ENDOSCOPY      Family History  Problem Relation Age of Onset   Asthma Mother    Hypertension Mother     Social History   Tobacco Use   Smoking status: Every Day    Current packs/day: 0.50    Types: Cigarettes   Smokeless tobacco: Never  Vaping Use   Vaping status: Never Used  Substance Use Topics   Alcohol use: Not Currently   Drug use: Never    ROS   Objective:   Vitals: BP 125/89 (BP Location: Right Arm)   Pulse 82   Temp 98 F (36.7 C) (Oral)   Resp 16   LMP 10/06/2023   SpO2 96%   Physical Exam Constitutional:      General: She is not in acute distress.    Appearance: Normal appearance. She is well-developed. She is not ill-appearing, toxic-appearing or diaphoretic.  HENT:     Head: Normocephalic and atraumatic.     Nose: Nose normal.     Mouth/Throat:     Mouth: Mucous membranes are moist.  Eyes:     General: No scleral icterus.       Right eye: No discharge.        Left eye: No discharge.     Extraocular Movements: Extraocular movements intact.  Cardiovascular:     Rate and Rhythm: Normal rate.  Pulmonary:     Effort: Pulmonary effort is normal.  Musculoskeletal:     Thoracic back: Spasms and tenderness present. No swelling, edema, deformity, signs of trauma, lacerations or bony tenderness. Normal range of motion. No scoliosis.     Lumbar back: No swelling, edema, deformity, signs of trauma, lacerations, spasms, tenderness or bony tenderness. Normal range of motion. Negative right straight leg raise test and negative left straight leg raise  test. No scoliosis.  Skin:    General: Skin is warm and dry.  Neurological:     General: No focal deficit present.     Mental Status: She is alert and oriented to person, place, and time.     Motor: No weakness.     Coordination: Coordination normal.     Gait: Gait normal.     Deep Tendon Reflexes: Reflexes normal.  Psychiatric:        Mood and Affect: Mood normal.  Behavior: Behavior normal.        Thought Content: Thought content normal.        Judgment: Judgment normal.     Assessment and Plan :   PDMP not reviewed this encounter.  1. Chronic thoracic back pain, unspecified back pain laterality    Acute on chronic back pain that is severe.  Recommended prednisone , muscle relaxant.  Follow-up with the spine clinic as soon as possible.  Provided with a note for work.  Counseled patient on potential for adverse effects with medications prescribed/recommended today, ER and return-to-clinic precautions discussed, patient verbalized understanding.    Christopher Savannah, [place] 11/04/23 1212

## 2023-11-04 NOTE — ED Triage Notes (Addendum)
 Pt c/o mid back pain since 2017-worse again x 5 days-denies injury-no pain meds PTA

## 2024-01-14 ENCOUNTER — Ambulatory Visit
Admission: EM | Admit: 2024-01-14 | Discharge: 2024-01-14 | Disposition: A | Discharge status: Home / Self Care | Attending: Family Medicine | Admitting: Family Medicine

## 2024-01-14 DIAGNOSIS — J4520 Mild intermittent asthma, uncomplicated: Secondary | ICD-10-CM

## 2024-01-14 DIAGNOSIS — B349 Viral infection, unspecified: Secondary | ICD-10-CM | POA: Diagnosis not present

## 2024-01-14 MED ORDER — PROMETHAZINE-DM 6.25-15 MG/5ML PO SYRP: 5.0000 mL | ORAL_SOLUTION | Freq: Four times a day (QID) | ORAL | 0 refills | Status: AC | PRN

## 2024-01-14 MED ORDER — ALBUTEROL SULFATE HFA 108 (90 BASE) MCG/ACT IN AERS: 1.0000 | INHALATION_SPRAY | Freq: Four times a day (QID) | RESPIRATORY_TRACT | 0 refills | Status: AC | PRN

## 2024-01-14 MED ORDER — IPRATROPIUM BROMIDE 0.03 % NA SOLN
2.0000 | Freq: Two times a day (BID) | NASAL | 0 refills | Status: AC
Start: 2024-01-14 — End: ?

## 2024-01-14 NOTE — ED Triage Notes (Signed)
 Pt present with nasal congestion. States she slept under a fan and woke up with a cough and headaches. States she has taken dayquil and states it has not given her relief. Pt states she feels she has a cold.

## 2024-01-14 NOTE — ED Provider Notes (Signed)
 [facility]    CSN: [id number] Arrival date & time: 01/14/24  1826      History   Chief Complaint Chief Complaint  Patient presents with   Nasal Congestion   Cough    HPI VERLE BRILLHART is a 38 y.o. female  presents for evaluation of URI symptoms for 1 days. Patient reports associated symptoms of cough, congestion, headaches. Denies N/V/D, fever, sore throat, ear pain, body aches, shortness of breath. Patient does have a hx of asthma.  Does not have an inhaler.  Patient is not an active smoker.   Reports no known sick contacts.  Pt has taken DayQuil OTC for symptoms. Pt has no other concerns at this time.    Cough Associated symptoms: headaches     Past Medical History:  Diagnosis Date   Anemia    Asthma    Asthma    Heart murmur     There are no active problems to display for this patient.   Past Surgical History:  Procedure Laterality Date   BREAST SURGERY     UPPER GI ENDOSCOPY      OB History   No obstetric history on file.      Home Medications    Prior to Admission medications   Medication Sig Start Date End Date Taking? Authorizing Provider  albuterol  (VENTOLIN  HFA) 108 (90 Base) MCG/ACT inhaler Inhale 1-2 puffs into the lungs every 6 (six) hours as needed. 01/14/24  Yes Dodge Ator, Jodi R, NP  ipratropium (ATROVENT ) 0.03 % nasal spray Place 2 sprays into both nostrils every 12 (twelve) hours. 01/14/24  Yes Taquanna Borras, Jodi R, NP  promethazine -dextromethorphan (PROMETHAZINE -DM) 6.25-15 MG/5ML syrup Take 5 mLs by mouth 4 (four) times daily as needed for cough. 01/14/24  Yes Cyan Clippinger, Jodi R, NP  alum & mag hydroxide-simeth (MAALOX MAX) 400-400-40 MG/5ML suspension Take 10 mLs by mouth every 6 (six) hours as needed for indigestion. 07/01/23   Christopher Savannah, PA-C  buPROPion (WELLBUTRIN XL) 150 MG 24 hr tablet Take by mouth. 08/23/22   [provider]  cyclobenzaprine  (FLEXERIL ) 5 MG tablet Take 1 tablet (5 mg total) by mouth at bedtime as needed. 11/04/23    Christopher Savannah, PA-C  diclofenac  (VOLTAREN ) 75 MG EC tablet Take 1 tablet (75 mg total) by mouth 2 (two) times daily as needed (pain). 03/30/23   Vonna Sharlet POUR, MD  docusate sodium  (COLACE) 100 MG capsule Take 1 capsule (100 mg total) by mouth every 12 (twelve) hours. 07/19/22   Christopher Savannah, PA-C  DULoxetine (CYMBALTA) 20 MG capsule Take by mouth. 08/23/22   [provider]  famotidine  (PEPCID ) 20 MG tablet Take 1 tablet (20 mg total) by mouth 2 (two) times daily for 15 days. 07/19/22 04/27/23  Christopher Savannah, PA-C  fluticasone  (FLONASE ) 50 MCG/ACT nasal spray Place 1 spray into both nostrils daily. 04/27/23   Marylan Glore, Jodi R, NP  levocetirizine (XYZAL ) 5 MG tablet Take 1 tablet (5 mg total) by mouth every evening. 12/03/21   Christopher Savannah, PA-C  lidocaine  (XYLOCAINE ) 5 % ointment Apply 1 Application topically 2 (two) times daily as needed for mild pain (pain score 1-3) or moderate pain (pain score 4-6) (Apply this cream to your external hemorhoid). 08/29/23   Hinnant, Collin F, PA-C  loperamide  (IMODIUM ) 2 MG capsule Take 1 capsule (2 mg total) by mouth 4 (four) times daily as needed for diarrhea or loose stools. 05/29/23   Randol Simmonds, MD  mometasone  (ELOCON ) 0.1 % cream Apply 1 Application  topically daily. To affected area till better. Please cancel other steroid creams. Mometasone  cream is on MCD pdl. 11/09/22   Vonna Sharlet POUR, MD  omeprazole  (PRILOSEC) 20 MG capsule Take 1 capsule (20 mg total) by mouth daily. 05/29/23   Randol Simmonds, MD  ondansetron  (ZOFRAN ) 4 MG tablet Take one tablet (4 mg dose) by mouth every 8 (eight) hours as needed for Nausea. 05/29/23   Randol Simmonds, MD  ondansetron  (ZOFRAN -ODT) 8 MG disintegrating tablet Take 1 tablet (8 mg total) by mouth every 8 (eight) hours as needed for nausea or vomiting. 07/01/23   Christopher Savannah, PA-C  predniSONE  (DELTASONE ) 20 MG tablet Take 2 tablets daily with breakfast. 11/04/23   Christopher Savannah, PA-C  sertraline  (ZOLOFT ) 25 MG tablet Take 1 tablet (25 mg  total) by mouth daily. 01/12/22 07/11/22  Joesph Shaver Scales, PA-C  silver  sulfADIAZINE  (SILVADENE ) 1 % cream Apply 1 Application topically 2 (two) times daily. 08/25/22   Christopher Savannah, PA-C  triamcinolone  cream (KENALOG ) 0.1 % Apply 1 Application topically 2 (two) times daily. 08/06/23   Christopher Savannah, PA-C  WEGOVY  1.7 MG/0.75ML SOAJ Inject 0.75 mLs (1.7 mg dose) into the skin once a week. 06/04/23       Family History Family History  Problem Relation Age of Onset   Asthma Mother    Hypertension Mother     Social History Social History   Tobacco Use   Smoking status: Every Day    Current packs/day: 0.50    Types: Cigarettes   Smokeless tobacco: Never  Vaping Use   Vaping status: Never Used  Substance Use Topics   Alcohol use: Not Currently   Drug use: Never     Allergies   Ibuprofen, Influenza vaccines, Shellfish allergy, and Wild lettuce extract (lactuca virosa)   Review of Systems Review of Systems  HENT:  Positive for congestion.   Respiratory:  Positive for cough.   Neurological:  Positive for headaches.     Physical Exam Triage Vital Signs ED Triage Vitals  Encounter Vitals Group     BP 01/14/24 1844 118/83     Girls Systolic BP Percentile --      Girls Diastolic BP Percentile --      Boys Systolic BP Percentile --      Boys Diastolic BP Percentile --      Pulse Rate 01/14/24 1844 95     Resp 01/14/24 1844 16     Temp 01/14/24 1844 99.2 F (37.3 C)     Temp Source 01/14/24 1844 Oral     SpO2 01/14/24 1844 98 %     Weight --      Height --      Head Circumference --      Peak Flow --      Pain Score 01/14/24 1842 6     Pain Loc --      Pain Education --      Exclude from Growth Chart --    No data found.  Updated Vital Signs BP 118/83 (BP Location: Left Arm)   Pulse 95   Temp 99.2 F (37.3 C) (Oral)   Resp 16   LMP 01/10/2024 (Exact Date)   SpO2 98%   Visual Acuity Right Eye Distance:   Left Eye Distance:   Bilateral Distance:    Right  Eye Near:   Left Eye Near:    Bilateral Near:     Physical Exam Vitals and nursing note reviewed.  Constitutional:  General: She is not in acute distress.    Appearance: She is well-developed. She is not ill-appearing.  HENT:     Head: Normocephalic and atraumatic.     Right Ear: Tympanic membrane and ear canal normal.     Left Ear: Tympanic membrane and ear canal normal.     Nose: Congestion present.     Mouth/Throat:     Mouth: Mucous membranes are moist.     Pharynx: Oropharynx is clear. Uvula midline. No oropharyngeal exudate or posterior oropharyngeal erythema.     Tonsils: No tonsillar exudate or tonsillar abscesses.  Eyes:     Conjunctiva/sclera: Conjunctivae normal.     Pupils: Pupils are equal, round, and reactive to light.  Cardiovascular:     Rate and Rhythm: Normal rate and regular rhythm.     Heart sounds: Normal heart sounds.  Pulmonary:     Effort: Pulmonary effort is normal.     Breath sounds: Normal breath sounds. No wheezing or rhonchi.  Musculoskeletal:     Cervical back: Normal range of motion and neck supple.  Lymphadenopathy:     Cervical: No cervical adenopathy.  Skin:    General: Skin is warm and dry.  Neurological:     General: No focal deficit present.     Mental Status: She is alert and oriented to person, place, and time.  Psychiatric:        Mood and Affect: Mood normal.        Behavior: Behavior normal.      UC Treatments / Results  Labs (all labs ordered are listed, but only abnormal results are displayed) Labs Reviewed - No data to display  EKG   Radiology No results found.  Procedures Procedures (including critical care time)  Medications Ordered in UC Medications - No data to display  Initial Impression / Assessment and Plan / UC Course  I have reviewed the triage vital signs and the nursing notes.  Pertinent labs & imaging results that were available during my care of the patient were reviewed by me and considered  in my medical decision making (see chart for details).     Reviewed exam and symptoms with patient.  No red flags.  She declines COVID testing.  Discussed viral illness and symptomatic treatment.  Atrovent  nasal spray as needed for congestion.  Promethazine  DM as needed for cough, side effect profile reviewed.  Refilled albuterol  inhaler to have available if needed.  Advise rest fluids and PCP follow-up if symptoms do not improve.  ER precautions reviewed Final Clinical Impressions(s) / UC Diagnoses   Final diagnoses:  Viral illness  Mild intermittent asthma, unspecified whether complicated     Discharge Instructions      I have refilled her albuterol  inhaler to use as needed for wheezing or shortness of breath.  You may take Promethazine  DM as needed for your cough.  Please do this medication make you drowsy.  Do not drink alcohol or drive while on this medication.  You may use the Atrovent  nasal spray to help with your nasal congestion.  Lots of rest and fluids.  Please follow-up with your PCP if your symptoms do not improve.  Please go to the ER for any worsening symptoms.  I hope you feel better soon!    ED Prescriptions     Medication Sig Dispense Auth. Provider   ipratropium (ATROVENT ) 0.03 % nasal spray Place 2 sprays into both nostrils every 12 (twelve) hours. 30 mL Emerald Gehres, Jodi R, NP  promethazine -dextromethorphan (PROMETHAZINE -DM) 6.25-15 MG/5ML syrup Take 5 mLs by mouth 4 (four) times daily as needed for cough. 118 mL Koah Chisenhall, Jodi R, NP   albuterol  (VENTOLIN  HFA) 108 (90 Base) MCG/ACT inhaler Inhale 1-2 puffs into the lungs every 6 (six) hours as needed. 1 each Loreda Myla SAUNDERS, NP      PDMP not reviewed this encounter.   Loreda Myla SAUNDERS, NP 01/14/24 [phone]

## 2024-01-14 NOTE — Discharge Instructions (Signed)
 I have refilled her albuterol  inhaler to use as needed for wheezing or shortness of breath.  You may take Promethazine  DM as needed for your cough.  Please do this medication make you drowsy.  Do not drink alcohol or drive while on this medication.  You may use the Atrovent  nasal spray to help with your nasal congestion.  Lots of rest and fluids.  Please follow-up with your PCP if your symptoms do not improve.  Please go to the ER for any worsening symptoms.  I hope you feel better soon!

## 2024-01-15 ENCOUNTER — Ambulatory Visit: Attending: Cardiovascular Disease | Admitting: Cardiovascular Disease

## 2024-01-16 ENCOUNTER — Encounter: Payer: Self-pay | Admitting: Cardiovascular Disease

## 2024-05-21 ENCOUNTER — Ambulatory Visit: Admitting: Physician Assistant
